# Patient Record
Sex: Female | Born: 1980 | Race: Black or African American | Hispanic: No | Marital: Single | Smoking: Current some day smoker
Health system: Southern US, Community
[De-identification: ages and names within clinical notes are randomized; demographics above are authoritative.]

## PROBLEM LIST (undated history)

## (undated) DIAGNOSIS — R7303 Prediabetes: Secondary | ICD-10-CM

## (undated) DIAGNOSIS — K219 Gastro-esophageal reflux disease without esophagitis: Secondary | ICD-10-CM

## (undated) DIAGNOSIS — E669 Obesity, unspecified: Secondary | ICD-10-CM

## (undated) HISTORY — PX: TUBAL LIGATION: SHX77

## (undated) HISTORY — DX: Prediabetes: R73.03

## (undated) HISTORY — PX: ABDOMINAL HYSTERECTOMY: SHX81

## (undated) HISTORY — PX: PARTIAL HYSTERECTOMY: SHX80

## (undated) HISTORY — DX: Obesity, unspecified: E66.9

## (undated) HISTORY — DX: Gastro-esophageal reflux disease without esophagitis: K21.9

---

## 2001-06-29 ENCOUNTER — Emergency Department (HOSPITAL_COMMUNITY): Admission: EM | Admit: 2001-06-29 | Discharge: 2001-06-29 | Payer: Self-pay | Admitting: Emergency Medicine

## 2001-11-20 ENCOUNTER — Emergency Department (HOSPITAL_COMMUNITY): Admission: EM | Admit: 2001-11-20 | Discharge: 2001-11-20 | Payer: Self-pay | Admitting: Emergency Medicine

## 2002-02-01 ENCOUNTER — Ambulatory Visit (HOSPITAL_COMMUNITY): Admission: AD | Admit: 2002-02-01 | Discharge: 2002-02-01 | Payer: Self-pay | Admitting: Obstetrics and Gynecology

## 2002-02-22 ENCOUNTER — Ambulatory Visit (HOSPITAL_COMMUNITY): Admission: AD | Admit: 2002-02-22 | Discharge: 2002-02-22 | Payer: Self-pay | Admitting: Obstetrics and Gynecology

## 2002-04-08 ENCOUNTER — Ambulatory Visit (HOSPITAL_COMMUNITY): Admission: AD | Admit: 2002-04-08 | Discharge: 2002-04-08 | Payer: Self-pay | Admitting: Obstetrics and Gynecology

## 2002-04-21 ENCOUNTER — Ambulatory Visit (HOSPITAL_COMMUNITY): Admission: AD | Admit: 2002-04-21 | Discharge: 2002-04-21 | Payer: Self-pay | Admitting: Obstetrics and Gynecology

## 2002-04-25 ENCOUNTER — Inpatient Hospital Stay (HOSPITAL_COMMUNITY): Admission: RE | Admit: 2002-04-25 | Discharge: 2002-04-27 | Payer: Self-pay | Admitting: Obstetrics and Gynecology

## 2002-05-26 ENCOUNTER — Ambulatory Visit (HOSPITAL_COMMUNITY): Admission: RE | Admit: 2002-05-26 | Discharge: 2002-05-26 | Payer: Self-pay | Admitting: Obstetrics and Gynecology

## 2002-07-06 ENCOUNTER — Emergency Department (HOSPITAL_COMMUNITY): Admission: EM | Admit: 2002-07-06 | Discharge: 2002-07-06 | Payer: Self-pay

## 2002-07-07 ENCOUNTER — Encounter: Payer: Self-pay | Admitting: *Deleted

## 2002-07-07 ENCOUNTER — Emergency Department (HOSPITAL_COMMUNITY): Admission: EM | Admit: 2002-07-07 | Discharge: 2002-07-07 | Payer: Self-pay | Admitting: *Deleted

## 2002-09-05 ENCOUNTER — Encounter: Payer: Self-pay | Admitting: *Deleted

## 2002-09-05 ENCOUNTER — Emergency Department (HOSPITAL_COMMUNITY): Admission: EM | Admit: 2002-09-05 | Discharge: 2002-09-05 | Payer: Self-pay | Admitting: *Deleted

## 2003-02-15 ENCOUNTER — Emergency Department (HOSPITAL_COMMUNITY): Admission: EM | Admit: 2003-02-15 | Discharge: 2003-02-15 | Payer: Self-pay | Admitting: Emergency Medicine

## 2003-07-23 ENCOUNTER — Emergency Department (HOSPITAL_COMMUNITY): Admission: EM | Admit: 2003-07-23 | Discharge: 2003-07-23 | Payer: Self-pay | Admitting: Emergency Medicine

## 2004-01-09 ENCOUNTER — Emergency Department (HOSPITAL_COMMUNITY): Admission: EM | Admit: 2004-01-09 | Discharge: 2004-01-09 | Payer: Self-pay | Admitting: *Deleted

## 2004-04-04 ENCOUNTER — Ambulatory Visit: Payer: Self-pay | Admitting: Family Medicine

## 2004-05-07 ENCOUNTER — Ambulatory Visit: Payer: Self-pay | Admitting: Family Medicine

## 2004-07-25 ENCOUNTER — Ambulatory Visit: Payer: Self-pay | Admitting: Family Medicine

## 2005-02-16 ENCOUNTER — Emergency Department (HOSPITAL_COMMUNITY): Admission: EM | Admit: 2005-02-16 | Discharge: 2005-02-16 | Payer: Self-pay | Admitting: Emergency Medicine

## 2006-03-04 ENCOUNTER — Emergency Department (HOSPITAL_COMMUNITY): Admission: EM | Admit: 2006-03-04 | Discharge: 2006-03-04 | Payer: Self-pay | Admitting: Emergency Medicine

## 2008-03-19 ENCOUNTER — Emergency Department (HOSPITAL_COMMUNITY): Admission: EM | Admit: 2008-03-19 | Discharge: 2008-03-19 | Payer: Self-pay | Admitting: Emergency Medicine

## 2009-03-01 ENCOUNTER — Encounter: Payer: Self-pay | Admitting: Family Medicine

## 2009-12-01 ENCOUNTER — Emergency Department (HOSPITAL_COMMUNITY): Admission: EM | Admit: 2009-12-01 | Discharge: 2009-12-01 | Payer: Self-pay | Admitting: Emergency Medicine

## 2010-07-12 LAB — URINALYSIS, ROUTINE W REFLEX MICROSCOPIC
Bilirubin Urine: NEGATIVE
Glucose, UA: 100 mg/dL — AB
Ketones, ur: NEGATIVE mg/dL
Nitrite: POSITIVE — AB
Protein, ur: 100 mg/dL — AB
Specific Gravity, Urine: 1.015 (ref 1.005–1.030)
Urobilinogen, UA: 1 mg/dL (ref 0.0–1.0)
pH: 6 (ref 5.0–8.0)

## 2010-07-12 LAB — URINE MICROSCOPIC-ADD ON

## 2010-07-12 LAB — URINE CULTURE
Colony Count: 5000
Culture  Setup Time: 201108062017

## 2010-07-12 LAB — PREGNANCY, URINE: Preg Test, Ur: NEGATIVE

## 2010-07-29 ENCOUNTER — Emergency Department (HOSPITAL_COMMUNITY)
Admission: EM | Admit: 2010-07-29 | Discharge: 2010-07-29 | Disposition: A | Discharge status: Home / Self Care | Payer: Medicaid Other | Attending: Emergency Medicine | Admitting: Emergency Medicine

## 2010-07-29 DIAGNOSIS — N920 Excessive and frequent menstruation with regular cycle: Secondary | ICD-10-CM | POA: Insufficient documentation

## 2010-07-29 LAB — CBC
MCH: 27.7 pg (ref 26.0–34.0)
MCHC: 31.9 g/dL (ref 30.0–36.0)
MCV: 86.9 fL (ref 78.0–100.0)
Platelets: 242 10*3/uL (ref 150–400)
RBC: 4.04 MIL/uL (ref 3.87–5.11)
RDW: 14 % (ref 11.5–15.5)

## 2010-07-29 LAB — DIFFERENTIAL
Basophils Relative: 1 % (ref 0–1)
Eosinophils Absolute: 0.1 10*3/uL (ref 0.0–0.7)
Eosinophils Relative: 2 % (ref 0–5)
Lymphs Abs: 2.3 10*3/uL (ref 0.7–4.0)
Monocytes Absolute: 0.4 10*3/uL (ref 0.1–1.0)
Monocytes Relative: 7 % (ref 3–12)
Neutrophils Relative %: 46 % (ref 43–77)

## 2010-07-29 LAB — POCT PREGNANCY, URINE: Preg Test, Ur: NEGATIVE

## 2010-09-13 NOTE — H&P (Signed)
   NAME:  Emma Huber, Emma Huber                          ACCOUNT NO.:  [id number]   MEDICAL RECORD NO.:  [id number]                   PATIENT TYPE:  INP   LOCATION:  A418                                 FACILITY:  [facility]   PHYSICIAN:  Tilda Burrow, M.D.              DATE OF BIRTH:  [birth year]   DATE OF ADMISSION:  04/25/2002  DATE OF DISCHARGE:                                HISTORY & PHYSICAL   REASON FOR ADMISSION:  Pregnancy at 38 weeks with spontaneous rupture of  membranes.   HISTORY OF PRESENT ILLNESS:  The patient is a 30 year old gravida 4, para 3  who was awakened this morning at approximately 4 a.m. with spontaneous  rupture of membranes and some mild cramping.  She presented to the hospital  with gross rupture of membranes and some irregular contractions.   PAST MEDICAL HISTORY:  Negative.   PAST SURGICAL HISTORY:  Negative.   ALLERGIES:  No known drug allergies.   MEDICATIONS:  Prenatal vitamins.   SOCIAL HISTORY:  She is single.  The father is present and supportive.   PRENATAL COURSE:  Essentially, uneventful.  Blood type is O positive.  UDS  was positive for marijuana.  Rubella is immune.  Hepatitis B surface antigen  is negative.  HIV is negative.  Serology nonreactive.  GC/Chlamydia are both  negative.  AFP's within normal limits.  A 28-week hemoglobin 7.1, hematocrit  32.9, 1-hour glucose tolerance was 67.  She is prior GBS positive.   PHYSICAL EXAMINATION:   VITAL SIGNS:  Stable.  Fetal heart rate is stable with accelerations noted.  There is clear leaking of fluids.   PELVIC:  Cervix is 3-4 cm, about 80% effaced and a -1 station.   PLAN:  We are going to admit and Pitocin augmentation of labor and start IV  antibiotics.      Zerita Boers, Reita Cliche, M.D.   DL/MEDQ  D:  32/35/5732  T:  04/25/2002  Job:  [id number]   cc:   [facility]

## 2010-09-13 NOTE — Op Note (Signed)
NAME:  Emma Huber, Emma Huber                          ACCOUNT NO.:  [id number]   MEDICAL RECORD NO.:  [id number]                   PATIENT TYPE:  AMB   LOCATION:  DAY                                  FACILITY:  [facility]   PHYSICIAN:  Tilda Burrow, M.D.              DATE OF BIRTH:  1980-05-01   DATE OF PROCEDURE:  05/26/2002  DATE OF DISCHARGE:                                 OPERATIVE REPORT   PREOPERATIVE DIAGNOSIS:  Elective sterilization.   POSTOPERATIVE DIAGNOSIS:  Elective sterilization.   PROCEDURE:  Laparoscopic tubal sterilization with Falope rings.   SURGEON:  Christin Bach, M.D.   ASSISTANT:  None.   ANESTHESIA:  General.   COMPLICATIONS:  None.   FINDINGS:  Normal uterus, tubes, and ovaries.  Good pelvic support.   INDICATION:  Elective permanent sterilization.   DETAILS OF PROCEDURE:  The patient was taken to the operating room, prepped  and  draped for a combined abdominal and vaginal procedure, with Hulka  tenaculum attached to the cervix for uterine  manipulation. Bladder in-and-  out catheterization. An infraumbilical, 1 cm vertical incision, as well as a  transverse suprapubic 1 cm incision. Veress needle was used to introduce  pneumoperitoneum through the umbilical incision with the pneumoperitoneum  easily introduced under 10 mmHg of pressure. Introduction of the Veress  needle was done, carefully elevating the abdominal wall and orienting the  needle toward the pelvis.   The laparoscopic trocar was then carefully introduced into the abdomen using  a similar technique, and the laparoscope was used to visualize normal pelvic  anatomy with no evidence of bleeding or trauma. The suprapubic trocar was  introduced under direct visualization, and then attention was directed to  the left fallopian tube, which was identified up to its fimbriated end,  elevated and a mid-segment loop of the tube was drawn up into the Falope  ring applier, Marcaine 0.25% applied to  the surface of the tube and the  Falope ring applied, inspected, and found to be in satisfactory position.  The opposite tube was then treated in a similar fashion. The mesosalpinx  beneath the Falope ring on each side was then infiltrated with approximately  3 cc of Marcaine 0.25%, using a transabdominal approach with a 22-gauge  spinal needle. Then the laparoscopic equipment was removed after instilling  200 cc of saline into  the abdomen and deflating the abdomen. Subcuticular 4-0 Dexon was used to  close the skin and Steri-Strips were placed on the skin surface. Sponge and  needle counts were correct. The patient tolerated the procedure well, was  awakened, and went to the recovery room in good condition.  Tilda Burrow, M.D.    JVF/MEDQ  D:  05/26/2002  T:  05/26/2002  Job:  [id number]

## 2010-09-13 NOTE — Op Note (Signed)
   NAME:  ASHELYNN, MARKS                          ACCOUNT NO.:  [id number]   MEDICAL RECORD NO.:  [id number]                   PATIENT TYPE:  INP   LOCATION:  A418                                 FACILITY:  [facility]   PHYSICIAN:  Tilda Burrow, M.D.              DATE OF BIRTH:  04/26/1981   DATE OF PROCEDURE:  DATE OF DISCHARGE:                                 OPERATIVE REPORT   ONSET OF LABOR:  April 25, 2002 at 4:30 a.m.   DATE OF DELIVERY:  April 25, 2002 at 9:42 a.m.   LENGTH OF FIRST STAGE LABOR:  Three hours and 58 minutes.   LENGTH OF SECOND STAGE LABOR:  14 minutes.   LENGTH OF THIRD STAGE LABOR:  16 minutes.   DELIVER NOTE:  The patient had a normal spontaneous vaginal delivery of a  viable female infant weighing 7 pounds 6.5 ounces.  Upon inspection perineum  was noted to be intact.  With delivery of infant, infant had vigorous cry,  good tone, pinked up well, moving all extremities.  Infant was thoroughly  suctioned.  Cord was clamped.  The infant dried and placed on the mother's  abdomen in stable condition.  Third stage of labor was actively managed with  20 units Pitocin and 1000 cc of D5 LR at a rapid rate.  Placenta was  spontaneously delivered via [facility] mechanism.  Three vessel cord was  noted.  Membranes were noted intact upon inspection.  Repeat inspection of  perineum was noted to be intact.     Zerita Boers, Reita Cliche, M.D.    DL/MEDQ  D:  04/54/0981  T:  04/25/2002  Job:  [id number]   cc:   [facility]

## 2010-11-04 ENCOUNTER — Emergency Department (HOSPITAL_COMMUNITY)
Admission: EM | Admit: 2010-11-04 | Discharge: 2010-11-04 | Disposition: A | Discharge status: Home / Self Care | Payer: Self-pay | Attending: Emergency Medicine | Admitting: Emergency Medicine

## 2010-11-04 DIAGNOSIS — T1590XA Foreign body on external eye, part unspecified, unspecified eye, initial encounter: Secondary | ICD-10-CM | POA: Insufficient documentation

## 2010-11-04 DIAGNOSIS — Y9229 Other specified public building as the place of occurrence of the external cause: Secondary | ICD-10-CM | POA: Insufficient documentation

## 2010-11-04 MED ORDER — ERYTHROMYCIN 5 MG/GM OP OINT
TOPICAL_OINTMENT | Freq: Once | OPHTHALMIC | Status: AC
  Administered 2010-11-04: 17:00:00 via OPHTHALMIC
  Filled 2010-11-04: qty 3.5

## 2010-11-04 MED ORDER — HYDROCODONE-ACETAMINOPHEN 5-325 MG PO TABS
1.0000 | ORAL_TABLET | Freq: Once | ORAL | Status: AC
  Administered 2010-11-04: 1 via ORAL
  Filled 2010-11-04: qty 1

## 2010-11-04 MED ORDER — KETOROLAC TROMETHAMINE 0.5 % OP SOLN
1.0000 [drp] | Freq: Once | OPHTHALMIC | Status: AC
  Administered 2010-11-04: 1 [drp] via OPHTHALMIC
  Filled 2010-11-04: qty 5

## 2010-11-04 MED ORDER — TETRACAINE HCL 0.5 % OP SOLN
2.0000 [drp] | Freq: Once | OPHTHALMIC | Status: AC
  Administered 2010-11-04: 2 [drp] via OPHTHALMIC
  Filled 2010-11-04: qty 2

## 2010-11-04 MED ORDER — FLUORESCEIN SODIUM 1 MG OP STRP
1.0000 | ORAL_STRIP | Freq: Once | OPHTHALMIC | Status: AC
  Administered 2010-11-04: 1 via OPHTHALMIC
  Filled 2010-11-04: qty 1

## 2010-11-04 NOTE — ED Notes (Signed)
Pt presents with nail glue to bilat eyes. Pt rinsed eyes with water and then placed clear eye eye dropps in both eyes. Pt tearful in triage.

## 2010-11-04 NOTE — ED Notes (Signed)
Order obtained from Dr. Bebe Shaggy to start Morgan's Lens with NS.

## 2010-11-04 NOTE — ED Provider Notes (Signed)
History     Chief Complaint  Patient presents with  . Foreign Body in Eye   HPI Comments: She had nail glue splash into both eyes prior to arrival while having a manicure done.  Patient is a 30 y.o. female presenting with foreign body in eye. The history is provided by the patient.  Foreign Body in Eye This is a new problem. The current episode started today. The problem occurs constantly. The problem has been unchanged. She has tried nothing for the symptoms. The treatment provided no relief.  Foreign Body in Eye This is a new problem. The current episode started today. The problem occurs constantly. The problem has been unchanged. She has tried nothing for the symptoms. The treatment provided no relief.    History reviewed. No pertinent past medical history.  History reviewed. No pertinent past surgical history.  History reviewed. No pertinent family history.  History  Substance Use Topics  . Smoking status: Never Smoker   . Smokeless tobacco: Not on file  . Alcohol Use: No    OB History    Grav Para Term Preterm Abortions TAB SAB Ect Mult Living                  Review of Systems  All other systems reviewed and are negative.    Physical Exam  BP 127/73  Pulse 84  Temp(Src) 98.7 F (37.1 C) (Oral)  Resp 20  Ht 5\' 7"  (1.702 m)  Wt 220 lb (99.791 kg)  BMI 34.46 kg/m2  SpO2 100%  Physical Exam  Constitutional: She appears well-developed and well-nourished. She appears distressed.  HENT:  Head: Normocephalic.  Eyes: EOM are normal. Pupils are equal, round, and reactive to light. Foreign body present in the right eye. Foreign body present in the left eye. Right conjunctiva is injected. Left conjunctiva is injected.  Slit lamp exam:      The right eye shows no corneal abrasion and no fluorescein uptake.       The left eye shows foreign body and fluorescein uptake. The left eye shows no corneal abrasion.         Adherent glue.  PH - 7.5 bilateral eyes after  flushing with 500 cc NS each eye.    ED Course  Procedures  MDM Spoke with Dr. Lita Mains - advised ketorolac,  abx ointment.  Will see in office in 1-2 days if not improved,  May need "bandage contact" if not better.  Patient agreeable with plan   Medical screening examination/treatment/procedure(s) were performed by non-physician practitioner and as supervising physician I was immediately available for consultation/collaboration. Visual acuity 20/70 both eyes - not wearing her glasses.  Candis Musa, PA 11/04/10 1652  Joya Gaskins, MD 11/11/10 [phone]

## 2011-01-02 ENCOUNTER — Encounter (HOSPITAL_COMMUNITY): Payer: Self-pay

## 2011-01-02 ENCOUNTER — Emergency Department (HOSPITAL_COMMUNITY): Payer: Medicaid Other

## 2011-01-02 ENCOUNTER — Emergency Department (HOSPITAL_COMMUNITY)
Admission: EM | Admit: 2011-01-02 | Discharge: 2011-01-02 | Disposition: A | Discharge status: Home / Self Care | Payer: Medicaid Other | Attending: Emergency Medicine | Admitting: Emergency Medicine

## 2011-01-02 DIAGNOSIS — F17200 Nicotine dependence, unspecified, uncomplicated: Secondary | ICD-10-CM | POA: Insufficient documentation

## 2011-01-02 DIAGNOSIS — N898 Other specified noninflammatory disorders of vagina: Secondary | ICD-10-CM | POA: Insufficient documentation

## 2011-01-02 DIAGNOSIS — D649 Anemia, unspecified: Secondary | ICD-10-CM | POA: Insufficient documentation

## 2011-01-02 DIAGNOSIS — N939 Abnormal uterine and vaginal bleeding, unspecified: Secondary | ICD-10-CM

## 2011-01-02 DIAGNOSIS — Z9851 Tubal ligation status: Secondary | ICD-10-CM | POA: Insufficient documentation

## 2011-01-02 LAB — URINALYSIS, ROUTINE W REFLEX MICROSCOPIC
Bilirubin Urine: NEGATIVE
Leukocytes, UA: NEGATIVE
Nitrite: NEGATIVE
Specific Gravity, Urine: 1.015 (ref 1.005–1.030)
Urobilinogen, UA: 0.2 mg/dL (ref 0.0–1.0)
pH: 6.5 (ref 5.0–8.0)

## 2011-01-02 LAB — PREGNANCY, URINE: Preg Test, Ur: NEGATIVE

## 2011-01-02 LAB — CBC
HCT: 24.3 % — ABNORMAL LOW (ref 36.0–46.0)
MCH: 26.8 pg (ref 26.0–34.0)
MCHC: 31.3 g/dL (ref 30.0–36.0)
MCV: 85.6 fL (ref 78.0–100.0)
Platelets: 246 10*3/uL (ref 150–400)
RDW: 15.2 % (ref 11.5–15.5)
WBC: 6.7 10*3/uL (ref 4.0–10.5)

## 2011-01-02 LAB — WET PREP, GENITAL: Clue Cells Wet Prep HPF POC: NONE SEEN

## 2011-01-02 LAB — RPR: RPR Ser Ql: NONREACTIVE

## 2011-01-02 MED ORDER — PROMETHAZINE HCL 25 MG PO TABS: 25.0000 mg | ORAL_TABLET | Freq: Four times a day (QID) | ORAL | Status: AC | PRN

## 2011-01-02 MED ORDER — KETOROLAC TROMETHAMINE 60 MG/2ML IM SOLN
60.0000 mg | Freq: Once | INTRAMUSCULAR | Status: AC
  Administered 2011-01-02: 60 mg via INTRAMUSCULAR
  Filled 2011-01-02: qty 2

## 2011-01-02 MED ORDER — NAPROXEN 500 MG PO TABS: 500.0000 mg | ORAL_TABLET | Freq: Two times a day (BID) | ORAL | Status: DC

## 2011-01-02 MED ORDER — MEGESTROL ACETATE 40 MG PO TABS: ORAL_TABLET | ORAL | Status: DC

## 2011-01-02 MED ORDER — ONDANSETRON 8 MG PO TBDP
8.0000 mg | ORAL_TABLET | Freq: Once | ORAL | Status: AC
  Administered 2011-01-02: 8 mg via ORAL
  Filled 2011-01-02: qty 1

## 2011-01-02 NOTE — ED Notes (Signed)
Correction: Urine was collected via in and out cath.

## 2011-01-02 NOTE — ED Notes (Signed)
Pt says has been on her period for the past 30 days.  Says has been having to change her pad every hour.  C/O lower abd pain, headache, and passing blood clots.  Also c/o feeling weak and dizzy.

## 2011-01-02 NOTE — ED Notes (Signed)
Patient requesting something else for pain. EDP made aware. EDP to room to talk to patient.

## 2011-01-02 NOTE — ED Provider Notes (Cosign Needed)
History   Chart scribed for Ward Givens, MD by Enos Fling; the patient was seen in room APA10/APA10; this patient's care was started at 8:31 AM.   CSN: [id number] Arrival date & time: 01/02/2011  8:20 AM  Chief Complaint  Patient presents with  . Abdominal Pain   HPI - Pt seen at 8:25 AM. Emma Huber is a 30 y.o. female who presents to the Emergency Department complaining of vaginal bleeding. Pt reports periods have been irregular x 7 months. Current period has lasted 3.5 weeks and is heavier than usual with clots and increased lower abd cramping ("like contractions") with passing of clots, and with associated nausea, headaches, and mood swings. Also c/o worsening dizziness and generalized weakness. No vomiting or fevers. Pt has not seen GYN yet but is on waiting list in [place]. Pt seen at [facility] since onset, has had pelvic exams there with no findings. Pt taking one iron pill per day for past 3 weeks. No other PMH. Pt G4P4, no h/o GYN problems. Pt reports mother and all aunts had hysterectomy before age 25. PT describes abdominal cramping then she passes a large clot.   PCP [facility]  History reviewed. No pertinent past medical history.  Past Surgical History  Procedure Date  . Tubal ligation     History reviewed. No pertinent family history.  History  Substance Use Topics  . Smoking status: Current Some Day Smoker  . Smokeless tobacco: Not on file  . Alcohol Use: Yes     occasionally    OB History    Grav Para Term Preterm Abortions TAB SAB Ect Mult Living                 Previous Medications   ACETAMINOPHEN (TYLENOL) 500 MG TABLET    Take 1,000 mg by mouth every 6 (six) hours as needed. Pain    IRON PO    Take 1 tablet by mouth daily.     NAPHAZOLINE (CLEAR EYES) 0.012 % OPHTHALMIC SOLUTION    Place 1 drop into both eyes 4 (four) times daily. Eye Irritation      Allergies as of 01/02/2011  . (No Known Allergies)     Review of Systems  All other systems reviewed and are  negative.   10 Systems reviewed and are negative for acute change except as noted in the HPI.  Physical Exam  BP 109/58  Pulse 70  Temp(Src) 98.1 F (36.7 C) (Oral)  Resp 18  Ht 5\' 6"  (1.676 m)  Wt 215 lb (97.523 kg)  BMI 34.70 kg/m2  SpO2 100%  LMP 12/06/2010  Physical Exam  Constitutional: She appears well-developed and well-nourished.  HENT:  Head: Normocephalic and atraumatic.  Eyes: Conjunctivae and EOM are normal. Pupils are equal, round, and reactive to light.       Pale conjunctiva  Cardiovascular: Normal rate, regular rhythm and normal heart sounds.   Pulmonary/Chest: Effort normal and breath sounds normal.  Abdominal: Soft. Normal appearance. There is tenderness in the suprapubic area. There is no rigidity, no guarding and no CVA tenderness.  Genitourinary: Pelvic exam was performed with patient supine. There is no rash or lesion on the right labia. There is no rash or lesion on the left labia.       Normal external genitalia, has some dark blood in vault. Has some tenderness of the uterus, nontender adenexa  Musculoskeletal: Normal range of motion.  Neurological: She is alert. She has normal strength. A cranial nerve deficit  is present.  Skin: Skin is warm, dry and intact. No rash noted.  Psychiatric: Her speech is normal and behavior is normal. Thought content normal.       Pt is tearful    ED Course  Procedures  OTHER DATA REVIEWED: Nursing notes and vital signs reviewed. Prior records reviewed.  LABS / RADIOLOGY: Results for orders placed during the hospital encounter of 01/02/11  CBC      Component Value Range   WBC 6.7  4.0 - 10.5 (K/uL)   RBC 2.84 (*) 3.87 - 5.11 (MIL/uL)   Hemoglobin 7.6 (*) 12.0 - 15.0 (g/dL)   HCT 16.1 (*) 09.6 - 46.0 (%)   MCV 85.6  78.0 - 100.0 (fL)   MCH 26.8  26.0 - 34.0 (pg)   MCHC 31.3  30.0 - 36.0 (g/dL)   RDW 04.5  40.9 - 81.1 (%)   Platelets 246  150 - 400 (K/uL)  WET PREP, GENITAL      Component Value Range    Yeast, Wet Prep NONE SEEN  NONE SEEN    Trich, Wet Prep NONE SEEN  NONE SEEN    Clue Cells, Wet Prep NONE SEEN  NONE SEEN    WBC, Wet Prep HPF POC NONE SEEN  NONE SEEN   URINALYSIS, ROUTINE W REFLEX MICROSCOPIC      Component Value Range   Color, Urine STRAW (*) YELLOW    Appearance CLEAR  CLEAR    Specific Gravity, Urine 1.015  1.005 - 1.030    pH 6.5  5.0 - 8.0    Glucose, UA NEGATIVE  NEGATIVE (mg/dL)   Hgb urine dipstick NEGATIVE  NEGATIVE    Bilirubin Urine NEGATIVE  NEGATIVE    Ketones, ur NEGATIVE  NEGATIVE (mg/dL)   Protein, ur NEGATIVE  NEGATIVE (mg/dL)   Urobilinogen, UA 0.2  0.0 - 1.0 (mg/dL)   Nitrite NEGATIVE  NEGATIVE    Leukocytes, UA NEGATIVE  NEGATIVE    Labs normal except for anemia  US Transvaginal Non-ob  01/02/2011  *RADIOLOGY REPORT*  Clinical Data: Heavy abnormal bleeding and pelvic pain  TRANSABDOMINAL AND TRANSVAGINAL ULTRASOUND OF PELVIS Technique:  Both transabdominal and transvaginal ultrasound examinations of the pelvis were performed. Transabdominal technique was performed for global imaging of the pelvis including uterus, ovaries, adnexal regions, and pelvic cul-de-sac.  Comparison: None.   It was necessary to proceed with endovaginal exam following the transabdominal exam to visualize the endometrium.  Findings: The uterus is normal in size and echotexture, measuring 9.7 x 6.3 x 6.0 cm.  Endometrial stripe is thicken and heterogeneous, measuring 21 mm in width.  Both ovaries have a normal size and appearance.  The right ovary measures 4.1 x 1.9 x 2.9 cm, and the left ovary measures 4.2 x 2.5 x 2.9 cm.  There is a 2.8 cm simple cyst on the left ovary which is within normal limits.  No adnexal masses or free pelvic fluid are identified.  IMPRESSION: Thick, heterogeneous endometrium.  Tissue sampling is recommended with the history of abnormal bleeding.  Original Report Authenticated By: Brandon Melnick, M.D.    ED COURSE: 12:38 PM Dr Emelda Fear put on megace TID  x3d, BID x 3d then daily (30 tabs). To call to be seen in the office in 2 weeks.     MDM: PT given Toradol for pain. Have discussed Dr Rayna Sexton plan and she is agreeable but now states she owes him money, that was why she was referred to a GYN in  [place] where she is on a waiting list.   IMPRESSION: 1. Anemia   2. Abnormal vaginal bleeding      PLAN: Discharge All results reviewed and discussed with pt, questions answered, pt agreeable with plan, will try to see Dr. Emelda Fear and if not then GYN in [place] in 2 weeks.  Follow-up Information    Make an appointment with [facility] Department.   Contact information:   [address] [place] [address] [phone]       Make an appointment with Tilda Burrow, MD.   Contact information:   [facility] [address], [address] [place] [address] [phone]           CONDITION ON DISCHARGE: stable  MEDS GIVEN IN ED:  Medications  ketorolac (TORADOL) injection 60 mg (60 mg Intramuscular Given 01/02/11 0857)  ondansetron (ZOFRAN-ODT) disintegrating tablet 8 mg (8 mg Oral Given 01/02/11 0856)     DISCHARGE MEDICATIONS: New Prescriptions   MEGESTROL (MEGACE) 40 MG TABLET    Take 3 po QD x3 then 2 po QD x 3d then once daily until gone.   NAPROXEN (NAPROSYN) 500 MG TABLET    Take 1 tablet (500 mg total) by mouth 2 (two) times daily.   PROMETHAZINE (PHENERGAN) 25 MG TABLET    Take 1 tablet (25 mg total) by mouth every 6 (six) hours as needed for nausea.    I personally performed the services described in this documentation, which was scribed in my presence. The recorded information has been reviewed and considered. Devoria Albe, MD, FACEP       Ward Givens, MD 01/02/11 5284  Ward Givens, MD 01/02/11 1324

## 2011-01-03 LAB — GC/CHLAMYDIA PROBE AMP, GENITAL: Chlamydia, DNA Probe: NEGATIVE

## 2011-01-28 LAB — URINALYSIS, ROUTINE W REFLEX MICROSCOPIC
Nitrite: NEGATIVE
Protein, ur: NEGATIVE
Specific Gravity, Urine: 1.02
Urobilinogen, UA: 0.2

## 2011-01-28 LAB — CBC
MCHC: 33.4
RBC: 4.16
WBC: 6

## 2011-01-28 LAB — BASIC METABOLIC PANEL
Calcium: 9.2
Creatinine, Ser: 0.9
GFR calc Af Amer: >60
GFR calc non Af Amer: >60

## 2011-01-28 LAB — DIFFERENTIAL
Basophils Relative: 0
Lymphocytes Relative: 29
Lymphs Abs: 1.7
Monocytes Relative: 8
Neutro Abs: 3.6
Neutrophils Relative %: 61

## 2011-01-28 LAB — WET PREP, GENITAL: Trich, Wet Prep: NONE SEEN

## 2011-01-28 LAB — GC/CHLAMYDIA PROBE AMP, GENITAL: Chlamydia, DNA Probe: NEGATIVE

## 2011-03-03 ENCOUNTER — Other Ambulatory Visit: Payer: Self-pay | Admitting: Obstetrics & Gynecology

## 2011-03-05 ENCOUNTER — Encounter (HOSPITAL_COMMUNITY): Payer: Self-pay | Admitting: Pharmacy Technician

## 2011-03-07 ENCOUNTER — Encounter (HOSPITAL_COMMUNITY): Payer: Self-pay

## 2011-03-07 ENCOUNTER — Encounter (HOSPITAL_COMMUNITY)
Admission: RE | Admit: 2011-03-07 | Discharge: 2011-03-07 | Disposition: A | Discharge status: Home / Self Care | Payer: Medicaid Other | Source: Ambulatory Visit | Attending: Obstetrics & Gynecology | Admitting: Obstetrics & Gynecology

## 2011-03-07 LAB — COMPREHENSIVE METABOLIC PANEL WITH GFR
ALT: 17 U/L (ref 0–35)
AST: 19 U/L (ref 0–37)
Albumin: 3.7 g/dL (ref 3.5–5.2)
Alkaline Phosphatase: 62 U/L (ref 39–117)
BUN: 13 mg/dL (ref 6–23)
CO2: 27 meq/L (ref 19–32)
Calcium: 9.8 mg/dL (ref 8.4–10.5)
Chloride: 103 meq/L (ref 96–112)
Creatinine, Ser: 1.03 mg/dL (ref 0.50–1.10)
GFR calc Af Amer: 82 mL/min — ABNORMAL LOW
GFR calc non Af Amer: 71 mL/min — ABNORMAL LOW
Glucose, Bld: 93 mg/dL (ref 70–99)
Potassium: 4 meq/L (ref 3.5–5.1)
Sodium: 138 meq/L (ref 135–145)
Total Bilirubin: 0.2 mg/dL — ABNORMAL LOW (ref 0.3–1.2)
Total Protein: 8.3 g/dL (ref 6.0–8.3)

## 2011-03-07 LAB — CBC
HCT: 36 % (ref 36.0–46.0)
Hemoglobin: 11.1 g/dL — ABNORMAL LOW (ref 12.0–15.0)
MCH: 26.2 pg (ref 26.0–34.0)
MCHC: 30.8 g/dL (ref 30.0–36.0)
MCV: 84.9 fL (ref 78.0–100.0)
Platelets: 268 K/uL (ref 150–400)
RBC: 4.24 MIL/uL (ref 3.87–5.11)
RDW: 14.5 % (ref 11.5–15.5)
WBC: 5.7 K/uL (ref 4.0–10.5)

## 2011-03-07 LAB — URINALYSIS, ROUTINE W REFLEX MICROSCOPIC
Bilirubin Urine: NEGATIVE
Glucose, UA: NEGATIVE mg/dL
Ketones, ur: NEGATIVE mg/dL
Nitrite: NEGATIVE
Protein, ur: NEGATIVE mg/dL
Specific Gravity, Urine: 1.025 (ref 1.005–1.030)
Urobilinogen, UA: 0.2 mg/dL (ref 0.0–1.0)
pH: 6 (ref 5.0–8.0)

## 2011-03-07 LAB — URINE MICROSCOPIC-ADD ON

## 2011-03-07 NOTE — Patient Instructions (Addendum)
18 Emma Huber  03/07/2011   Your procedure is scheduled on: 03/12/2011  Report to [facility] at  730  AM.  Call this number if you have problems the morning of surgery: [phone]   Remember:   Do not eat food:After Midnight.  Do not drink clear liquids: After Midnight.  Take these medicines the morning of surgery with A SIP OF WATER:  none  Do not wear jewelry, make-up or nail polish.  Do not wear lotions, powders, or perfumes. You may wear deodorant.  Do not shave 48 hours prior to surgery.  Do not bring valuables to the hospital.  Contacts, dentures or bridgework may not be worn into surgery.  Leave suitcase in the car. After surgery it may be brought to your room.  For patients admitted to the hospital, checkout time is 11:00 AM the day of discharge.   Patients discharged the day of surgery will not be allowed to drive home.  Name and phone number of your driver: family  Special Instructions: CHG Shower Use Special Wash: 1/2 bottle night before surgery and 1/2 bottle morning of surgery.   Please read over the following fact sheets that you were given: Pain Booklet, MRSA Information, Surgical Site Infection Prevention, Anesthesia Post-op Instructions and Care and Recovery After Surgery Endometrial Ablation Endometrial ablation removes the lining of the uterus (endometrium). It is usually a same day, outpatient treatment. Ablation helps avoid major surgery (such as a hysterectomy). A hysterectomy is removal of the cervix and uterus. Endometrial ablation has less risk and complications, has a shorter recovery period and is less expensive. After endometrial ablation, most women will have little or no menstrual bleeding. You may not keep your fertility. Pregnancy is no longer likely after this procedure but if you are pre-menopausal, you still need to use a reliable method of birth control following the procedure because pregnancy can occur. REASONS TO HAVE THE PROCEDURE MAY  INCLUDE:  Heavy periods.   Bleeding that is causing anemia.   Anovulatory bleeding, very irregular, bleeding.   Bleeding submucous fibroids (on the lining inside the uterus) if they are smaller than 3 centimeters.  REASONS NOT TO HAVE THE PROCEDURE MAY INCLUDE:  You wish to have more children.   You have a pre-cancerous or cancerous problem. The cause of any abnormal bleeding must be diagnosed before having the procedure.   You have pain coming from the uterus.   You have a submucus fibroid larger than 3 centimeters.   You recently had a baby.   You recently had an infection in the uterus.   You have a severe retro-flexed, tipped uterus and cannot insert the instrument to do the ablation.   You had a Cesarean section or deep major surgery on the uterus.   The inner cavity of the uterus is too large for the endometrial ablation instrument.  RISKS AND COMPLICATIONS   Perforation of the uterus.   Bleeding.   Infection of the uterus, bladder or vagina.   Injury to surrounding organs.   Cutting the cervix.   An air bubble to the lung (air embolus).   Pregnancy following the procedure.   Failure of the procedure to help the problem requiring hysterectomy.   Decreased ability to diagnose cancer in the lining of the uterus.  BEFORE THE PROCEDURE  The lining of the uterus must be tested to make sure there is no pre-cancerous or cancer cells present.   Medications may be given to make the lining of  the uterus thinner.   Ultrasound may be used to evaluate the size and look for abnormalities of the uterus.   Future pregnancy is not desired.  PROCEDURE  There are different ways to destroy the lining of the uterus.   Resectoscope - radio frequency-alternating electric current is the most common one used.   Cryotherapy - freezing the lining of the uterus.   Heated Free Liquid - heated salt (saline) solution inserted into the uterus.   Microwave - uses high energy  microwaves in the uterus.   Thermal Balloon - a catheter with a balloon tip is inserted into the uterus and filled with heated fluid.  Your caregiver will talk with you about the method used in this clinic. They will also instruct you on the pros and cons of the procedure. Endometrial ablation is performed along with a procedure called operative hysteroscopy. A narrow viewing tube is inserted through the birth canal (vagina) and through the cervix into the uterus. A tiny camera attached to the viewing tube (hysteroscope) allows the uterine cavity to be shown on a TV monitor during surgery. Your uterus is filled with a harmless liquid to make the procedure easier. The lining of the uterus is then removed. The lining can also be removed with a resectoscope which allows your surgeon to cut away the lining of the uterus under direct vision. Usually, you will be able to go home within an hour after the procedure. HOME CARE INSTRUCTIONS   Do not drive for 24 hours.   No tampons, douching or intercourse for 2 weeks or until your caregiver approves.   Rest at home for 24 to 48 hours. You may then resume normal activities unless told differently by your caregiver.   Take your temperature two times a day for 4 days, and record it.   Take any medications your caregiver has ordered, as directed.   Use some form of contraception if you are pre-menopausal and do not want to get pregnant.  Bleeding after the procedure is normal. It varies from light spotting and mildly watery to bloody discharge for 4 to 6 weeks. You may also have mild cramping. Only take over-the-counter or prescription medicines for pain, discomfort, or fever as directed by your caregiver. Do not use aspirin, as this may aggravate bleeding. Frequent urination during the first 24 hours is normal. You will not know how effective your surgery is until at least 3 months after the surgery. SEEK IMMEDIATE MEDICAL CARE IF:   Bleeding is heavier than  a normal menstrual cycle.   An oral temperature above 102 F (38.9 C) develops.   You have increasing cramps or pains not relieved with medication or develop belly (abdominal) pain which does not seem to be related to the same area of earlier cramping and pain.   You are light headed, weak or have fainting episodes.   You develop pain in the shoulder strap areas.   You have chest or leg pain.   You have abnormal vaginal discharge.   You have painful urination.  Document Released: 02/22/2004 Document Revised: 12/25/2010 Document Reviewed: 05/22/2007 [facility] Patient Information 2012 [place], [place].PATIENT INSTRUCTIONS POST-ANESTHESIA  IMMEDIATELY FOLLOWING SURGERY:  Do not drive or operate machinery for the first twenty four hours after surgery.  Do not make any important decisions for twenty four hours after surgery or while taking narcotic pain medications or sedatives.  If you develop intractable nausea and vomiting or a severe headache please notify your doctor immediately.  FOLLOW-UP:  Please make an appointment with your surgeon as instructed. You do not need to follow up with anesthesia unless specifically instructed to do so.  WOUND CARE INSTRUCTIONS (if applicable):  Keep a dry clean dressing on the anesthesia/puncture wound site if there is drainage.  Once the wound has quit draining you may leave it open to air.  Generally you should leave the bandage intact for twenty four hours unless there is drainage.  If the epidural site drains for more than 36-48 hours please call the anesthesia department.  QUESTIONS?:  Please feel free to call your physician or the hospital operator if you have any questions, and they will be happy to assist you.     [facility] Anesthesia Department [address] [place] [phone]

## 2011-03-12 ENCOUNTER — Ambulatory Visit (HOSPITAL_COMMUNITY)
Admission: RE | Admit: 2011-03-12 | Discharge: 2011-03-12 | Disposition: A | Discharge status: Home / Self Care | Payer: Medicaid Other | Source: Ambulatory Visit | Attending: Obstetrics & Gynecology | Admitting: Obstetrics & Gynecology

## 2011-03-12 ENCOUNTER — Encounter (HOSPITAL_COMMUNITY): Payer: Self-pay | Admitting: Anesthesiology

## 2011-03-12 ENCOUNTER — Encounter (HOSPITAL_COMMUNITY): Payer: Self-pay | Admitting: *Deleted

## 2011-03-12 ENCOUNTER — Other Ambulatory Visit: Payer: Self-pay | Admitting: Obstetrics & Gynecology

## 2011-03-12 ENCOUNTER — Ambulatory Visit (HOSPITAL_COMMUNITY): Payer: Medicaid Other | Admitting: Anesthesiology

## 2011-03-12 ENCOUNTER — Encounter (HOSPITAL_COMMUNITY)
Admission: RE | Disposition: A | Discharge status: Home / Self Care | Payer: Self-pay | Source: Ambulatory Visit | Attending: Obstetrics & Gynecology

## 2011-03-12 DIAGNOSIS — N920 Excessive and frequent menstruation with regular cycle: Secondary | ICD-10-CM | POA: Insufficient documentation

## 2011-03-12 DIAGNOSIS — D259 Leiomyoma of uterus, unspecified: Secondary | ICD-10-CM | POA: Insufficient documentation

## 2011-03-12 DIAGNOSIS — Z9889 Other specified postprocedural states: Secondary | ICD-10-CM

## 2011-03-12 HISTORY — PX: ENDOMETRIAL ABLATION: SHX621

## 2011-03-12 HISTORY — PX: HYSTEROSCOPY W/D&C: SHX1775

## 2011-03-12 SURGERY — DILATATION AND CURETTAGE /HYSTEROSCOPY
Anesthesia: General | Site: Uterus | Wound class: Clean Contaminated

## 2011-03-12 MED ORDER — OXYCODONE-ACETAMINOPHEN 5-325 MG PO TABS
1.0000 | ORAL_TABLET | Freq: Once | ORAL | Status: AC
  Administered 2011-03-12: 1 via ORAL

## 2011-03-12 MED ORDER — MIDAZOLAM HCL 2 MG/2ML IJ SOLN
INTRAMUSCULAR | Status: AC
  Administered 2011-03-12: 2 mg via INTRAVENOUS
  Filled 2011-03-12: qty 2

## 2011-03-12 MED ORDER — HYDROCODONE-ACETAMINOPHEN 5-500 MG PO TABS: 1.0000 | ORAL_TABLET | Freq: Four times a day (QID) | ORAL | Status: AC | PRN

## 2011-03-12 MED ORDER — PROPOFOL 10 MG/ML IV EMUL
INTRAVENOUS | Status: AC
  Filled 2011-03-12: qty 20

## 2011-03-12 MED ORDER — DEXTROSE 5 % IV SOLN
INTRAVENOUS | Status: DC | PRN
  Administered 2011-03-12: 500 mL via INTRAVENOUS

## 2011-03-12 MED ORDER — MIDAZOLAM HCL 2 MG/2ML IJ SOLN
INTRAMUSCULAR | Status: AC
  Filled 2011-03-12: qty 2

## 2011-03-12 MED ORDER — MIDAZOLAM HCL 5 MG/5ML IJ SOLN
INTRAMUSCULAR | Status: DC | PRN
  Administered 2011-03-12: 2 mg via INTRAVENOUS

## 2011-03-12 MED ORDER — LIDOCAINE HCL (PF) 1 % IJ SOLN
INTRAMUSCULAR | Status: AC
  Filled 2011-03-12: qty 5

## 2011-03-12 MED ORDER — CEFAZOLIN SODIUM 1-5 GM-% IV SOLN: 1.0000 g | INTRAVENOUS | Status: DC

## 2011-03-12 MED ORDER — CEFAZOLIN SODIUM 1-5 GM-% IV SOLN
INTRAVENOUS | Status: DC | PRN
  Administered 2011-03-12: 1 g via INTRAVENOUS

## 2011-03-12 MED ORDER — ONDANSETRON HCL 8 MG PO TABS: 8.0000 mg | ORAL_TABLET | Freq: Three times a day (TID) | ORAL | Status: AC | PRN

## 2011-03-12 MED ORDER — LIDOCAINE HCL 1 % IJ SOLN
INTRAMUSCULAR | Status: DC | PRN
  Administered 2011-03-12: 30 mg via INTRADERMAL

## 2011-03-12 MED ORDER — ONDANSETRON HCL 4 MG/2ML IJ SOLN
INTRAMUSCULAR | Status: AC
  Administered 2011-03-12: 4 mg via INTRAVENOUS
  Filled 2011-03-12: qty 2

## 2011-03-12 MED ORDER — ONDANSETRON HCL 4 MG/2ML IJ SOLN: 4.0000 mg | Freq: Once | INTRAMUSCULAR | Status: DC | PRN

## 2011-03-12 MED ORDER — FENTANYL CITRATE 0.05 MG/ML IJ SOLN
25.0000 ug | INTRAMUSCULAR | Status: DC | PRN
  Administered 2011-03-12: 50 ug via INTRAVENOUS

## 2011-03-12 MED ORDER — CEFAZOLIN SODIUM 1-5 GM-% IV SOLN
INTRAVENOUS | Status: AC
  Filled 2011-03-12: qty 50

## 2011-03-12 MED ORDER — OXYCODONE-ACETAMINOPHEN 5-325 MG PO TABS
ORAL_TABLET | ORAL | Status: AC
  Administered 2011-03-12: 1 via ORAL
  Filled 2011-03-12: qty 1

## 2011-03-12 MED ORDER — FENTANYL CITRATE 0.05 MG/ML IJ SOLN
INTRAMUSCULAR | Status: AC
  Filled 2011-03-12: qty 2

## 2011-03-12 MED ORDER — PROPOFOL 10 MG/ML IV EMUL
INTRAVENOUS | Status: DC | PRN
  Administered 2011-03-12: 160 mg via INTRAVENOUS
  Administered 2011-03-12: 20 mg via INTRAVENOUS

## 2011-03-12 MED ORDER — SODIUM CHLORIDE 0.9 % IR SOLN
Status: DC | PRN
  Administered 2011-03-12: 1000 mL

## 2011-03-12 MED ORDER — KETOROLAC TROMETHAMINE 30 MG/ML IJ SOLN
INTRAMUSCULAR | Status: AC
  Administered 2011-03-12: 30 mg via INTRAVENOUS
  Filled 2011-03-12: qty 1

## 2011-03-12 MED ORDER — MIDAZOLAM HCL 2 MG/2ML IJ SOLN
1.0000 mg | INTRAMUSCULAR | Status: DC | PRN
  Administered 2011-03-12 (×2): 2 mg via INTRAVENOUS

## 2011-03-12 MED ORDER — FENTANYL CITRATE 0.05 MG/ML IJ SOLN
INTRAMUSCULAR | Status: AC
  Administered 2011-03-12: 50 ug via INTRAVENOUS
  Filled 2011-03-12: qty 2

## 2011-03-12 MED ORDER — KETOROLAC TROMETHAMINE 10 MG PO TABS: 10.0000 mg | ORAL_TABLET | Freq: Three times a day (TID) | ORAL | Status: AC | PRN

## 2011-03-12 MED ORDER — FENTANYL CITRATE 0.05 MG/ML IJ SOLN
INTRAMUSCULAR | Status: DC | PRN
  Administered 2011-03-12 (×2): 25 ug via INTRAVENOUS
  Administered 2011-03-12: 50 ug via INTRAVENOUS

## 2011-03-12 MED ORDER — ONDANSETRON HCL 4 MG/2ML IJ SOLN
4.0000 mg | Freq: Once | INTRAMUSCULAR | Status: AC
  Administered 2011-03-12: 4 mg via INTRAVENOUS

## 2011-03-12 MED ORDER — KETOROLAC TROMETHAMINE 30 MG/ML IJ SOLN
30.0000 mg | Freq: Once | INTRAMUSCULAR | Status: AC
  Administered 2011-03-12: 30 mg via INTRAVENOUS

## 2011-03-12 MED ORDER — LACTATED RINGERS IV SOLN
INTRAVENOUS | Status: DC
Start: 2011-03-12 — End: 2011-03-12
  Administered 2011-03-12: 1000 mL via INTRAVENOUS

## 2011-03-12 SURGICAL SUPPLY — 32 items
BAG DECANTER FOR FLEXI CONT (MISCELLANEOUS) ×3 IMPLANT
BAG HAMPER (MISCELLANEOUS) ×3 IMPLANT
CATH THERMACHOICE III (CATHETERS) ×6 IMPLANT
CLOTH BEACON ORANGE TIMEOUT ST (SAFETY) ×3 IMPLANT
COVER LIGHT HANDLE STERIS (MISCELLANEOUS) ×6 IMPLANT
FORMALIN 10 PREFIL 120ML (MISCELLANEOUS) ×3 IMPLANT
GAUZE SPONGE 4X4 16PLY XRAY LF (GAUZE/BANDAGES/DRESSINGS) ×3 IMPLANT
GLOVE BIOGEL PI IND STRL 7.0 (GLOVE) ×4 IMPLANT
GLOVE BIOGEL PI IND STRL 8 (GLOVE) ×2 IMPLANT
GLOVE BIOGEL PI INDICATOR 7.0 (GLOVE) ×2
GLOVE BIOGEL PI INDICATOR 8 (GLOVE) ×1
GLOVE ECLIPSE 8.0 STRL XLNG CF (GLOVE) ×3 IMPLANT
GLOVE EXAM NITRILE MD LF STRL (GLOVE) ×3 IMPLANT
GLOVE SS BIOGEL STRL SZ 6.5 (GLOVE) ×2 IMPLANT
GLOVE SUPERSENSE BIOGEL SZ 6.5 (GLOVE) ×1
GOWN BRE IMP SLV AUR XL STRL (GOWN DISPOSABLE) ×6 IMPLANT
GOWN STRL REIN XL XLG (GOWN DISPOSABLE) ×3 IMPLANT
INST SET HYSTEROSCOPY (KITS) ×3 IMPLANT
IV D5W 500ML (IV SOLUTION) ×3 IMPLANT
IV NS IRRIG 3000ML ARTHROMATIC (IV SOLUTION) ×3 IMPLANT
KIT ROOM TURNOVER APOR (KITS) ×3 IMPLANT
MANIFOLD NEPTUNE II (INSTRUMENTS) ×3 IMPLANT
MARKER SKIN DUAL TIP RULER LAB (MISCELLANEOUS) ×3 IMPLANT
NS IRRIG 1000ML POUR BTL (IV SOLUTION) ×3 IMPLANT
PACK BASIC III (CUSTOM PROCEDURE TRAY) ×1
PACK SRG BSC III STRL LF ECLPS (CUSTOM PROCEDURE TRAY) ×2 IMPLANT
PAD ARMBOARD 7.5X6 YLW CONV (MISCELLANEOUS) ×3 IMPLANT
PAD TELFA 3X4 1S STER (GAUZE/BANDAGES/DRESSINGS) ×3 IMPLANT
SET BASIN LINEN APH (SET/KITS/TRAYS/PACK) ×3 IMPLANT
SET IRRIG Y TYPE TUR BLADDER L (SET/KITS/TRAYS/PACK) ×3 IMPLANT
SHEET LAVH (DRAPES) ×3 IMPLANT
YANKAUER SUCT BULB TIP 10FT TU (MISCELLANEOUS) ×3 IMPLANT

## 2011-03-12 NOTE — Op Note (Addendum)
Preoperative diagnosis:  1.  Menometrorrhagia                                         2.  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  Hysteroscopy and uterine curettage with attempted endometrial ablation                     2.  placement of the Mirena levered megestrol releasing intrauterine system  Surgeon:  Rockne Coons MD  Anesthesia:  Gen. Endotracheal  Findings:  Patient had a normal endometrial cavity with no fibroid polyps or other abnormalities.  However it was large and spongy.  Even though it measured 12 cm in ultrasound 3-dimensional he it was quite large and again very spongy uterus.  I used to pressure catheters both of which had catheter failures I suspect because of the large amount of fluid that I was using.  I had 120 cc in the balloon each time to maintain a pressure above 150 mmHg.  Description of operation:  Patient was taken to the operating room and placed in the supine position where she underwent general endotracheal anesthesia.  She was placed in the low lithotomy position and prepped and draped in the usual sterile fashion.  A Graves speculum was placed and the cervix was grasped with a single-tooth tenaculum.  The cervix was then dilated serially to allow passage of the hysteroscope.  A diagnostic hysteroscopy was performed and the endometrial cavity was found to be completely normal, there were no fibroids or polyp.  A vigorous uterine curettage was then performed with good uterine crie obtained in all areas.  The tissue was sent off to pathology for routine evaluation.  I then performed and endometrial ablation and it required 115 220 cc of D5W to made a pressure between 150 and 200 mm mercury.  I was confident both visually and clinically that there was absolutely no uterine perforation.  I get to pressure catheter air her readings of 6806 because of the large  volumes of fluid I was using.  As a result to try to salvage some clinical benefit for this patient, I called over to our office and had been sent over and Mirena IUD for placement.  My intent was to see if this would be adequate for control of her menstrual cycles.  Her only other option is going to be an abdominal hysterectomy because her uterus had no descent and was quite high in the abdomen.  The patient has had a previous tubal ligation so I thought it was worth a chance to try the Mirena.  If it is unsuccessful we can be easily removed in the office.    The patient was awakened from anesthesia and taken to the recovery room in good stable conditions with all counts being correct x3.  The patient received 1 g of Ancef and 30 mg of Toradol preoperatively.  Blood loss for the procedure was minimal.  There were no complications except for the inability to do the ablation because of the patient's large endometrial cavity.  Lazaro Arms 03/12/2011 10:23 AM     The Mirena IUD has Lot #[id number] use by date 06/2013.  If left in for the entire life of IUD needs to be changed on or  about 03/11/2016.  EURE,LUTHER H 03/12/2011 10:28 AM

## 2011-03-12 NOTE — Transfer of Care (Signed)
Immediate Anesthesia Transfer of Care Note  Patient: Emma Huber  Procedure(s) Performed:  DILATATION AND CURETTAGE (D&C) /HYSTEROSCOPY; ENDOMETRIAL ABLATION - attempted ablation    unsuccessful  Mirena IUD inserted Lot# [id number]   EXP-06/26/2013  Patient Location: PACU  Anesthesia Type: General  Level of Consciousness: awake, alert , oriented and patient cooperative  Airway & Oxygen Therapy: Patient Spontanous Breathing and Patient connected to face mask oxygen  Post-op Assessment: Report given to PACU RN, Post -op Vital signs reviewed and stable and Patient moving all extremities  Post vital signs: Reviewed and stable  Complications: No apparent anesthesia complications

## 2011-03-12 NOTE — Anesthesia Procedure Notes (Signed)
Procedures

## 2011-03-12 NOTE — Anesthesia Postprocedure Evaluation (Signed)
  Anesthesia Post-op Note  Patient: Emma Huber  Procedure(s) Performed:  DILATATION AND CURETTAGE (D&C) /HYSTEROSCOPY; ENDOMETRIAL ABLATION - attempted ablation    unsuccessful  Mirena IUD inserted Lot# [id number]   EXP-06/26/2013  Patient Location: PACU  Anesthesia Type: General  Level of Consciousness: awake, alert , oriented and patient cooperative  Airway and Oxygen Therapy: Patient Spontanous Breathing  Post-op Pain: 2 /10, mild  Post-op Assessment: Post-op Vital signs reviewed, Patient's Cardiovascular Status Stable, Respiratory Function Stable, Patent Airway and No signs of Nausea or vomiting  Post-op Vital Signs: Reviewed and stable  Complications: No apparent anesthesia complications

## 2011-03-12 NOTE — Anesthesia Preprocedure Evaluation (Addendum)
Anesthesia Evaluation  Patient identified by MRN, date of birth, ID band Patient awake    Reviewed: Allergy & Precautions, H&P , NPO status , Patient's Chart, lab work & pertinent test results  History of Anesthesia Complications Negative for: history of anesthetic complications  Airway Mallampati: I      Dental  (+) Teeth Intact   Pulmonary neg pulmonary ROS,  clear to auscultation        Cardiovascular neg cardio ROS Regular Normal    Neuro/Psych    GI/Hepatic negative GI ROS,   Endo/Other    Renal/GU      Musculoskeletal   Abdominal   Peds  Hematology   Anesthesia Other Findings   Reproductive/Obstetrics                           Anesthesia Physical Anesthesia Plan  ASA: I  Anesthesia Plan: General   Post-op Pain Management:    Induction: Intravenous  Airway Management Planned: LMA  Additional Equipment:   Intra-op Plan:   Post-operative Plan: Extubation in OR  Informed Consent: I have reviewed the patients History and Physical, chart, labs and discussed the procedure including the risks, benefits and alternatives for the proposed anesthesia with the patient or authorized representative who has indicated his/her understanding and acceptance.     Plan Discussed with:   Anesthesia Plan Comments:         Anesthesia Quick Evaluation

## 2011-03-12 NOTE — H&P (Signed)
Emma Huber is an 30 y.o. female. G 5 P 4 A 1 LMP 01/05/2011 status post BTL on megestrol with extremely heavy cycles.  She soils clothes and sheets and wears 2 super maxis per day on the first few days.  Pain not a major issue.  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   Past Surgical History  Procedure Date  . Tubal ligation 9 yrs ago    [facility]    Family History  Problem Relation Age of Onset  . Anesthesia problems Neg Hx   . Hypotension Neg Hx   . Malignant hyperthermia Neg Hx   . Pseudochol deficiency Neg Hx     Social History:  reports that she has been smoking Cigarettes.  She has a 1.75 pack-year smoking history. She does not have any smokeless tobacco history on file. She reports that she drinks alcohol. She reports that she does not use illicit drugs.  Allergies: No Known Allergies     ROS  Review of Systems  Constitutional: Negative for fever, chills, weight loss, malaise/fatigue and diaphoresis.  HENT: Negative for hearing loss, ear pain, nosebleeds, congestion, sore throat, neck pain, tinnitus and ear discharge.   Eyes: Negative for blurred vision, double vision, photophobia, pain, discharge and redness.  Respiratory: Negative for cough, hemoptysis, sputum production, shortness of breath, wheezing and stridor.   Cardiovascular: Negative for chest pain, palpitations, orthopnea, claudication, leg swelling and PND.  Gastrointestinal: Positive for abdominal pain with menses. Negative for heartburn, nausea, vomiting, diarrhea, constipation, blood in stool and melena.  Genitourinary: Negative for dysuria, urgency, frequency, hematuria and flank pain.  Musculoskeletal: Negative for myalgias, back pain, joint pain and falls.  Skin: Negative for itching and rash.  Neurological: Negative for dizziness, tingling, tremors, sensory change, speech change, focal weakness, seizures, loss of consciousness, weakness and headaches.  Endo/Heme/Allergies: Negative for environmental  allergies and polydipsia. Does not bruise/bleed easily.  Psychiatric/Behavioral: Negative for depression, suicidal ideas, hallucinations, memory loss and substance abuse. The patient is not nervous/anxious and does not have insomnia.       Physical Exam Physical Exam  Vitals reviewed. Constitutional: She is oriented to person, place, and time. She appears well-developed and well-nourished.  HENT:  Head: Normocephalic and atraumatic.  Right Ear: External ear normal.  Left Ear: External ear normal.  Nose: Nose normal.  Mouth/Throat: Oropharynx is clear and moist.  Eyes: Conjunctivae and EOM are normal. Pupils are equal, round, and reactive to light. Right eye exhibits no discharge. Left eye exhibits no discharge. No scleral icterus.  Neck: Normal range of motion. Neck supple. No tracheal deviation present. No thyromegaly present.  Cardiovascular: Normal rate, regular rhythm, normal heart sounds and intact distal pulses.  Exam reveals no gallop and no friction rub.   No murmur heard. Respiratory: Effort normal and breath sounds normal. No respiratory distress. She has no wheezes. She has no rales. She exhibits no tenderness.  GI: Soft. Bowel sounds are normal. She exhibits no distension and no mass. There is no tenderness. There is no rebound and no guarding.  Genitourinary:       Vulva is normal without lesions Vagina is pink moist without discharge Cervix normal in appearance and pap is normal Uterus is enlarged to 8 weeks size Adnexa is negative with normal sized ovaries by sonogram  Musculoskeletal: Normal range of motion. She exhibits no edema and no tenderness.  Neurological: She is alert and oriented to person, place, and time. She has normal reflexes. She displays normal reflexes. No  cranial nerve deficit. She exhibits normal muscle tone. Coordination normal.  Skin: Skin is warm and dry. No rash noted. No erythema. No pallor.  Psychiatric: She has a normal mood and affect. Her  behavior is normal. Judgment and thought content normal.    Recent Results (from the past 336 hour(s))  SURGICAL PCR SCREEN   Collection Time   03/07/11  8:15 AM      Component Value Range   MRSA, PCR NEGATIVE  NEGATIVE    Staphylococcus aureus NEGATIVE  NEGATIVE   URINALYSIS, ROUTINE W REFLEX MICROSCOPIC   Collection Time   03/07/11  8:15 AM      Component Value Range   Color, Urine YELLOW  YELLOW    Appearance HAZY (*) CLEAR    Specific Gravity, Urine 1.025  1.005 - 1.030    pH 6.0  5.0 - 8.0    Glucose, UA NEGATIVE  NEGATIVE (mg/dL)   Hgb urine dipstick SMALL (*) NEGATIVE    Bilirubin Urine NEGATIVE  NEGATIVE    Ketones, ur NEGATIVE  NEGATIVE (mg/dL)   Protein, ur NEGATIVE  NEGATIVE (mg/dL)   Urobilinogen, UA 0.2  0.0 - 1.0 (mg/dL)   Nitrite NEGATIVE  NEGATIVE    Leukocytes, UA TRACE (*) NEGATIVE   URINE MICROSCOPIC-ADD ON   Collection Time   03/07/11  8:15 AM      Component Value Range   Squamous Epithelial / LPF FEW (*) RARE    WBC, UA 3-6  <3 (WBC/hpf)   RBC / HPF 0-2  <3 (RBC/hpf)   Bacteria, UA FEW (*) RARE   CBC   Collection Time   03/07/11  8:30 AM      Component Value Range   WBC 5.7  4.0 - 10.5 (K/uL)   RBC 4.24  3.87 - 5.11 (MIL/uL)   Hemoglobin 11.1 (*) 12.0 - 15.0 (g/dL)   HCT 16.1  09.6 - 04.5 (%)   MCV 84.9  78.0 - 100.0 (fL)   MCH 26.2  26.0 - 34.0 (pg)   MCHC 30.8  30.0 - 36.0 (g/dL)   RDW 40.9  81.1 - 91.4 (%)   Platelets 268  150 - 400 (K/uL)  COMPREHENSIVE METABOLIC PANEL   Collection Time   03/07/11  8:30 AM      Component Value Range   Sodium 138  135 - 145 (mEq/L)   Potassium 4.0  3.5 - 5.1 (mEq/L)   Chloride 103  96 - 112 (mEq/L)   CO2 27  19 - 32 (mEq/L)   Glucose, Bld 93  70 - 99 (mg/dL)   BUN 13  6 - 23 (mg/dL)   Creatinine, Ser 7.82  0.50 - 1.10 (mg/dL)   Calcium 9.8  8.4 - 95.6 (mg/dL)   Total Protein 8.3  6.0 - 8.3 (g/dL)   Albumin 3.7  3.5 - 5.2 (g/dL)   AST 19  0 - 37 (U/L)   ALT 17  0 - 35 (U/L)   Alkaline Phosphatase 62  39  - 117 (U/L)   Total Bilirubin 0.2 (*) 0.3 - 1.2 (mg/dL)   GFR calc non Af Amer 71 (*) >90 (mL/min)   GFR calc Af Amer 82 (*) >90 (mL/min)  HCG, QUANTITATIVE, PREGNANCY   Collection Time   03/07/11  8:30 AM      Component Value Range   hCG, Beta Chain, Quant, S <1  <5 (mIU/mL)        Assessment/Plan: 1.  Menometrorrhagia 2.  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  For hysteroscopy D&C endometrial ablation.  Pt understands the risks of surgery including but not limited t  excessive bleeding requiring transfusion or reoperation, post-operative infection requiring prolonged hospitalization or re-hospitalization and antibiotic therapy, and damage to other organs including bladder, bowel, ureters and major vessels.  The patient also understands the alternative treatment options which were discussed in full.  All questions were answered.   EURE,LUTHER H 03/12/2011, 7:19 AM

## 2011-03-18 ENCOUNTER — Encounter (HOSPITAL_COMMUNITY): Payer: Self-pay | Admitting: Obstetrics & Gynecology

## 2011-09-15 ENCOUNTER — Encounter (HOSPITAL_COMMUNITY): Payer: Self-pay | Admitting: *Deleted

## 2011-09-15 ENCOUNTER — Emergency Department (HOSPITAL_COMMUNITY)
Admission: EM | Admit: 2011-09-15 | Discharge: 2011-09-15 | Disposition: A | Discharge status: Home / Self Care | Payer: No Typology Code available for payment source | Attending: Emergency Medicine | Admitting: Emergency Medicine

## 2011-09-15 DIAGNOSIS — M545 Low back pain, unspecified: Secondary | ICD-10-CM | POA: Insufficient documentation

## 2011-09-15 DIAGNOSIS — Y92410 Unspecified street and highway as the place of occurrence of the external cause: Secondary | ICD-10-CM | POA: Insufficient documentation

## 2011-09-15 DIAGNOSIS — S39012A Strain of muscle, fascia and tendon of lower back, initial encounter: Secondary | ICD-10-CM

## 2011-09-15 DIAGNOSIS — S335XXA Sprain of ligaments of lumbar spine, initial encounter: Secondary | ICD-10-CM | POA: Insufficient documentation

## 2011-09-15 DIAGNOSIS — F17200 Nicotine dependence, unspecified, uncomplicated: Secondary | ICD-10-CM | POA: Insufficient documentation

## 2011-09-15 MED ORDER — HYDROCODONE-ACETAMINOPHEN 5-325 MG PO TABS: ORAL_TABLET | ORAL | Status: DC

## 2011-09-15 MED ORDER — METHOCARBAMOL 500 MG PO TABS: ORAL_TABLET | ORAL | Status: DC
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
## 2011-09-15 NOTE — Discharge Instructions (Signed)
° °
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
## 2011-09-20 NOTE — ED Provider Notes (Signed)
Medical screening examination/treatment/procedure(s) were performed by non-physician practitioner and as supervising physician I was immediately available for consultation/collaboration. Anaston Koehn, MD, FACEP   Xzavian Semmel L Tynell Winchell, MD 09/20/11 2231 

## 2011-11-07 ENCOUNTER — Other Ambulatory Visit (HOSPITAL_COMMUNITY): Payer: No Typology Code available for payment source

## 2011-11-14 ENCOUNTER — Other Ambulatory Visit (HOSPITAL_COMMUNITY): Payer: No Typology Code available for payment source

## 2011-11-28 ENCOUNTER — Encounter (HOSPITAL_COMMUNITY): Payer: Self-pay | Admitting: Pharmacy Technician

## 2011-12-02 NOTE — Patient Instructions (Addendum)
20 Emma Huber  12/02/2011   Your procedure is scheduled on:  12/10/2011  Report to [facility] at  700  AM.  Call this number if you have problems the morning of surgery: [phone]   Remember:   Do not eat food:After Midnight.  May have clear liquids:until Midnight .    Take these medicines the morning of surgery with A SIP OF WATER: claritin,zantac   Do not wear jewelry, make-up or nail polish.  Do not wear lotions, powders, or perfumes. You may wear deodorant.  Do not shave 48 hours prior to surgery. Men may shave face and neck.  Do not bring valuables to the hospital.  Contacts, dentures or bridgework may not be worn into surgery.  Leave suitcase in the car. After surgery it may be brought to your room.  For patients admitted to the hospital, checkout time is 11:00 AM the day of discharge.   Patients discharged the day of surgery will not be allowed to drive home.  Name and phone number of your driver: family  Special Instructions: CHG Shower Use Special Wash: 1/2 bottle night before surgery and 1/2 bottle morning of surgery.   Please read over the following fact sheets that you were given: Pain Booklet, MRSA Information, Surgical Site Infection Prevention, Anesthesia Post-op Instructions and Care and Recovery After Surgery Laparoscopic Assisted Vaginal Hysterectomy  A laparoscopic assisted vaginal hysterectomy (LAVH) is a surgical procedure to remove the uterus and cervix, and sometimes the ovaries and fallopian tubes. During an LAVH, some of the surgical removal is done through the vagina and the rest is done through a few small surgical cuts (incisions) in the abdomen.  This procedure is usually considered in women when a vaginal hysterectomy (VH) is not an option. Your surgeon will discuss the risks and benefits of the different surgical techniques at your appointment. Symptoms such as chronic pelvic pain, pressure, and heavy or painful periods can be treated with an LAVH.  Generally, recovery time is faster and there are fewer complications after laparoscopic procedures than after open incisional procedures. LET YOUR CAREGIVER KNOW ABOUT:   Allergies to food or medicine.   Medicines taken, including vitamins, herbs, eyedrops, over-the-counter medicines, and creams.   Use of steroids (by mouth or creams).   Previous problems with anesthetics or numbing medicines.   History of bleeding problems or blood clots.   Previous surgery.   Other health problems, including diabetes and kidney problems.   Possibility of pregnancy.  RISKS AND COMPLICATIONS  Allergies to medicines.   Difficulty breathing.   Bleeding.   Infection.   Damage to other structures near the uterus and cervix.  BEFORE THE PROCEDURE  Ask your caregiver about changing or stopping your regular medicines.   Take certain medicines, such as a colon emptying preparation, as directed.   Do not eat or drink anything for at least 8 hours before your surgery.   Take a shower at home the night before your procedure.   Arrive without jewelry, makeup, nail polish, or any lotions or deodorants.   Stop smoking if you smoke. Stopping will improve your health after surgery.   Arrange for a ride home after surgery and for help at home during recovery.  PROCEDURE   An intravenous (IV) access tube will be put into one of your veins in order to give you fluids and medicines.   You will receive medicines to relax you and medicines that make you sleep (general anesthetic).  You may have a flexible tube (catheter) put into your bladder to drain urine.   You may have a tube put through your nose or mouth that goes into your stomach (nasogastric tube). The nasogastric tube removes digestive fluids and prevents you from feeling nauseous and vomiting.   Tight fitting (compression) stockings will be placed on your legs to promote circulation.   Three to four small incisions will be made in the  abdomen. An incision is also made in the vagina. Probes and tools are inserted into the small incisions. The uterus and cervix are removed (and possibly your ovaries and fallopian tubes) through the vagina, as well as through the 3 to 4 small incisions that were made in the abdomen.   The vagina is then sewn back to normal.   The procedure takes about 1 to 3 hours.  AFTER THE PROCEDURE  Plan to be in the hospital for 1 to 2 days.   You may have abdominal cramping and a sore throat. Your pain will be controlled with medicine.   You may have a liquid diet temporarily. You will most likely return to, and tolerate, your usual diet the day after surgery.   You will be passing urine through a catheter. It will be removed the day after surgery.   Your temperature, breathing rate, heart rate, blood pressure, and oxygen level will be monitored regularly.   You will still wear compression stockings on your legs until you are able to move around.   You will use a special device or do breathing exercises to keep your lungs clear.   You will be encouraged to walk as soon as possible.   Expect a full recovery in 4 to 6 weeks after surgery.  Document Released: 04/03/2011 Document Reviewed: 04/01/2011 [facility] Patient Information 2012 [place], [place].PATIENT INSTRUCTIONS POST-ANESTHESIA  IMMEDIATELY FOLLOWING SURGERY:  Do not drive or operate machinery for the first twenty four hours after surgery.  Do not make any important decisions for twenty four hours after surgery or while taking narcotic pain medications or sedatives.  If you develop intractable nausea and vomiting or a severe headache please notify your doctor immediately.  FOLLOW-UP:  Please make an appointment with your surgeon as instructed. You do not need to follow up with anesthesia unless specifically instructed to do so.  WOUND CARE INSTRUCTIONS (if applicable):  Keep a dry clean dressing on the anesthesia/puncture wound site if  there is drainage.  Once the wound has quit draining you may leave it open to air.  Generally you should leave the bandage intact for twenty four hours unless there is drainage.  If the epidural site drains for more than 36-48 hours please call the anesthesia department.  QUESTIONS?:  Please feel free to call your physician or the hospital operator if you have any questions, and they will be happy to assist you.

## 2011-12-03 ENCOUNTER — Other Ambulatory Visit: Payer: Self-pay | Admitting: Obstetrics & Gynecology

## 2011-12-03 ENCOUNTER — Encounter (HOSPITAL_COMMUNITY): Payer: Self-pay

## 2011-12-03 ENCOUNTER — Encounter (HOSPITAL_COMMUNITY)
Admission: RE | Admit: 2011-12-03 | Discharge: 2011-12-03 | Disposition: A | Discharge status: Home / Self Care | Payer: Medicaid Other | Source: Ambulatory Visit | Attending: Obstetrics & Gynecology | Admitting: Obstetrics & Gynecology

## 2011-12-03 LAB — COMPREHENSIVE METABOLIC PANEL
Albumin: 3.6 g/dL (ref 3.5–5.2)
BUN: 11 mg/dL (ref 6–23)
Calcium: 9.8 mg/dL (ref 8.4–10.5)
Chloride: 106 mEq/L (ref 96–112)
Creatinine, Ser: 1.06 mg/dL (ref 0.50–1.10)
Total Bilirubin: 0.2 mg/dL — ABNORMAL LOW (ref 0.3–1.2)

## 2011-12-03 LAB — URINALYSIS, ROUTINE W REFLEX MICROSCOPIC
Ketones, ur: NEGATIVE mg/dL
Leukocytes, UA: NEGATIVE
Nitrite: NEGATIVE
Protein, ur: NEGATIVE mg/dL
pH: 6 (ref 5.0–8.0)

## 2011-12-03 LAB — CBC
HCT: 38 % (ref 36.0–46.0)
MCH: 26.7 pg (ref 26.0–34.0)
MCHC: 31.6 g/dL (ref 30.0–36.0)
MCV: 84.4 fL (ref 78.0–100.0)
RDW: 14.1 % (ref 11.5–15.5)
WBC: 5.8 10*3/uL (ref 4.0–10.5)

## 2011-12-03 LAB — URINE MICROSCOPIC-ADD ON

## 2011-12-03 LAB — HCG, QUANTITATIVE, PREGNANCY: hCG, Beta Chain, Quant, S: <1 m[IU]/mL (ref <5)

## 2011-12-03 MED ORDER — KETOROLAC TROMETHAMINE 30 MG/ML IJ SOLN: 30.0000 mg | Freq: Once | INTRAMUSCULAR | Status: DC

## 2011-12-10 ENCOUNTER — Ambulatory Visit (HOSPITAL_COMMUNITY): Payer: Medicaid Other | Admitting: Anesthesiology

## 2011-12-10 ENCOUNTER — Encounter (HOSPITAL_COMMUNITY): Payer: Self-pay | Admitting: Anesthesiology

## 2011-12-10 ENCOUNTER — Ambulatory Visit (HOSPITAL_COMMUNITY)
Admission: RE | Admit: 2011-12-10 | Discharge: 2011-12-10 | Disposition: A | Discharge status: Home / Self Care | Payer: Medicaid Other | Source: Ambulatory Visit | Attending: Obstetrics & Gynecology | Admitting: Obstetrics & Gynecology

## 2011-12-10 ENCOUNTER — Encounter (HOSPITAL_COMMUNITY)
Admission: RE | Disposition: A | Discharge status: Home / Self Care | Payer: Self-pay | Source: Ambulatory Visit | Attending: Obstetrics & Gynecology

## 2011-12-10 ENCOUNTER — Encounter (HOSPITAL_COMMUNITY): Payer: Self-pay | Admitting: *Deleted

## 2011-12-10 DIAGNOSIS — Z90710 Acquired absence of both cervix and uterus: Secondary | ICD-10-CM

## 2011-12-10 DIAGNOSIS — N920 Excessive and frequent menstruation with regular cycle: Secondary | ICD-10-CM | POA: Insufficient documentation

## 2011-12-10 DIAGNOSIS — N946 Dysmenorrhea, unspecified: Secondary | ICD-10-CM | POA: Insufficient documentation

## 2011-12-10 HISTORY — PX: LAPAROSCOPIC SUPRACERVICAL HYSTERECTOMY: SHX5399

## 2011-12-10 HISTORY — PX: IUD REMOVAL: SHX5392

## 2011-12-10 LAB — TYPE AND SCREEN: ABO/RH(D): O POS

## 2011-12-10 SURGERY — HYSTERECTOMY, SUPRACERVICAL, LAPAROSCOPIC
Anesthesia: General | Wound class: Clean

## 2011-12-10 MED ORDER — NEOSTIGMINE METHYLSULFATE 1 MG/ML IJ SOLN
INTRAMUSCULAR | Status: DC | PRN
  Administered 2011-12-10: 3 mg via INTRAVENOUS

## 2011-12-10 MED ORDER — LIDOCAINE HCL (PF) 1 % IJ SOLN
INTRAMUSCULAR | Status: AC
  Filled 2011-12-10: qty 5

## 2011-12-10 MED ORDER — GLYCOPYRROLATE 0.2 MG/ML IJ SOLN
0.2000 mg | Freq: Once | INTRAMUSCULAR | Status: AC
  Administered 2011-12-10: 0.2 mg via INTRAVENOUS

## 2011-12-10 MED ORDER — FENTANYL CITRATE 0.05 MG/ML IJ SOLN
INTRAMUSCULAR | Status: DC | PRN
  Administered 2011-12-10 (×8): 50 ug via INTRAVENOUS

## 2011-12-10 MED ORDER — OXYCODONE-ACETAMINOPHEN 7.5-500 MG PO TABS: 1.0000 | ORAL_TABLET | Freq: Four times a day (QID) | ORAL | Status: AC | PRN

## 2011-12-10 MED ORDER — KETOROLAC TROMETHAMINE 30 MG/ML IJ SOLN
INTRAMUSCULAR | Status: AC
  Filled 2011-12-10: qty 1

## 2011-12-10 MED ORDER — PROPOFOL 10 MG/ML IV BOLUS
INTRAVENOUS | Status: DC | PRN
  Administered 2011-12-10: 200 mg via INTRAVENOUS

## 2011-12-10 MED ORDER — CEFAZOLIN SODIUM-DEXTROSE 2-3 GM-% IV SOLR
2.0000 g | INTRAVENOUS | Status: AC
  Administered 2011-12-10: 2 g via INTRAVENOUS

## 2011-12-10 MED ORDER — ONDANSETRON HCL 4 MG/2ML IJ SOLN
4.0000 mg | Freq: Once | INTRAMUSCULAR | Status: AC
Start: 2011-12-10 — End: 2011-12-10
  Administered 2011-12-10: 4 mg via INTRAVENOUS

## 2011-12-10 MED ORDER — FENTANYL CITRATE 0.05 MG/ML IJ SOLN
INTRAMUSCULAR | Status: AC
  Filled 2011-12-10: qty 5

## 2011-12-10 MED ORDER — GLYCOPYRROLATE 0.2 MG/ML IJ SOLN
INTRAMUSCULAR | Status: AC
  Filled 2011-12-10: qty 2

## 2011-12-10 MED ORDER — GLYCOPYRROLATE 0.2 MG/ML IJ SOLN
INTRAMUSCULAR | Status: AC
  Filled 2011-12-10: qty 1

## 2011-12-10 MED ORDER — ROCURONIUM BROMIDE 50 MG/5ML IV SOLN
INTRAVENOUS | Status: AC
  Filled 2011-12-10: qty 1

## 2011-12-10 MED ORDER — FENTANYL CITRATE 0.05 MG/ML IJ SOLN
25.0000 ug | INTRAMUSCULAR | Status: DC | PRN
  Administered 2011-12-10: 50 ug via INTRAVENOUS

## 2011-12-10 MED ORDER — MIDAZOLAM HCL 2 MG/2ML IJ SOLN
INTRAMUSCULAR | Status: AC
  Filled 2011-12-10: qty 2

## 2011-12-10 MED ORDER — GLYCOPYRROLATE 0.2 MG/ML IJ SOLN
INTRAMUSCULAR | Status: DC | PRN
  Administered 2011-12-10: .5 mg via INTRAVENOUS

## 2011-12-10 MED ORDER — ONDANSETRON HCL 4 MG/2ML IJ SOLN
4.0000 mg | Freq: Once | INTRAMUSCULAR | Status: AC | PRN
  Administered 2011-12-10: 4 mg via INTRAVENOUS

## 2011-12-10 MED ORDER — ROCURONIUM BROMIDE 100 MG/10ML IV SOLN
INTRAVENOUS | Status: DC | PRN
  Administered 2011-12-10: 50 mg via INTRAVENOUS
  Administered 2011-12-10: 10 mg via INTRAVENOUS
  Administered 2011-12-10: 50 mg via INTRAVENOUS
  Administered 2011-12-10: 10 mg via INTRAVENOUS

## 2011-12-10 MED ORDER — CEFAZOLIN SODIUM-DEXTROSE 2-3 GM-% IV SOLR
INTRAVENOUS | Status: AC
  Filled 2011-12-10: qty 50

## 2011-12-10 MED ORDER — LIDOCAINE HCL 1 % IJ SOLN
INTRAMUSCULAR | Status: DC | PRN
  Administered 2011-12-10: 50 mg via INTRADERMAL

## 2011-12-10 MED ORDER — ONDANSETRON HCL 4 MG/2ML IJ SOLN
INTRAMUSCULAR | Status: AC
  Filled 2011-12-10: qty 2

## 2011-12-10 MED ORDER — FENTANYL CITRATE 0.05 MG/ML IJ SOLN
INTRAMUSCULAR | Status: AC
  Filled 2011-12-10: qty 2

## 2011-12-10 MED ORDER — SODIUM CHLORIDE 0.9 % IR SOLN
Status: DC | PRN
  Administered 2011-12-10: 1

## 2011-12-10 MED ORDER — SODIUM CHLORIDE 0.9 % IR SOLN
Status: DC | PRN
  Administered 2011-12-10: 3000 mL

## 2011-12-10 MED ORDER — LACTATED RINGERS IV SOLN
INTRAVENOUS | Status: DC
  Administered 2011-12-10: 1000 mL via INTRAVENOUS
  Administered 2011-12-10: 10:00:00 via INTRAVENOUS

## 2011-12-10 MED ORDER — NEOSTIGMINE METHYLSULFATE 1 MG/ML IJ SOLN
INTRAMUSCULAR | Status: AC
  Filled 2011-12-10: qty 10

## 2011-12-10 MED ORDER — KETOROLAC TROMETHAMINE 10 MG PO TABS: 10.0000 mg | ORAL_TABLET | Freq: Three times a day (TID) | ORAL | Status: AC | PRN

## 2011-12-10 MED ORDER — MIDAZOLAM HCL 2 MG/2ML IJ SOLN
1.0000 mg | INTRAMUSCULAR | Status: DC | PRN
  Administered 2011-12-10: 2 mg via INTRAVENOUS

## 2011-12-10 MED ORDER — KETOROLAC TROMETHAMINE 30 MG/ML IJ SOLN
30.0000 mg | Freq: Once | INTRAMUSCULAR | Status: AC
  Administered 2011-12-10: 30 mg via INTRAVENOUS

## 2011-12-10 MED ORDER — PROPOFOL 10 MG/ML IV EMUL
INTRAVENOUS | Status: AC
  Filled 2011-12-10: qty 20

## 2011-12-10 MED ORDER — ONDANSETRON HCL 8 MG PO TABS: 8.0000 mg | ORAL_TABLET | Freq: Three times a day (TID) | ORAL | Status: AC | PRN

## 2011-12-10 SURGICAL SUPPLY — 41 items
APPLIER CLIP UNV 5X34 EPIX (ENDOMECHANICALS) ×2 IMPLANT
BAG HAMPER (MISCELLANEOUS) ×2 IMPLANT
BLADE LAPAROSCOPIC MORCELL KIT (BLADE) ×2 IMPLANT
BLADE SURG SZ11 CARB STEEL (BLADE) ×2 IMPLANT
CLOTH BEACON ORANGE TIMEOUT ST (SAFETY) ×2 IMPLANT
COVER LIGHT HANDLE STERIS (MISCELLANEOUS) ×4 IMPLANT
DRAPE PROXIMA HALF (DRAPES) ×2 IMPLANT
ELECT REM PT RETURN 9FT ADLT (ELECTROSURGICAL) ×2
ELECTRODE REM PT RTRN 9FT ADLT (ELECTROSURGICAL) ×1 IMPLANT
FILTER SMOKE EVAC LAPAROSHD (FILTER) ×2 IMPLANT
FORMALIN 10 PREFIL 480ML (MISCELLANEOUS) ×2 IMPLANT
GLOVE BIOGEL PI IND STRL 8 (GLOVE) ×1 IMPLANT
GLOVE BIOGEL PI INDICATOR 8 (GLOVE) ×1
GLOVE ECLIPSE 8.0 STRL XLNG CF (GLOVE) ×2 IMPLANT
GLOVE INDICATOR 6.5 STRL GRN (GLOVE) ×2 IMPLANT
GLOVE INDICATOR 7.0 STRL GRN (GLOVE) ×6 IMPLANT
GLOVE SS BIOGEL STRL SZ 6.5 (GLOVE) ×1 IMPLANT
GLOVE SUPERSENSE BIOGEL SZ 6.5 (GLOVE) ×1
GOWN PREVENTION PLUS XLARGE (GOWN DISPOSABLE) ×4 IMPLANT
GOWN STRL REIN XL XLG (GOWN DISPOSABLE) ×2 IMPLANT
INST SET LAPROSCOPIC GYN AP (KITS) ×2 IMPLANT
IV NS IRRIG 3000ML ARTHROMATIC (IV SOLUTION) ×2 IMPLANT
KIT ROOM TURNOVER APOR (KITS) ×2 IMPLANT
MANIFOLD NEPTUNE II (INSTRUMENTS) ×2 IMPLANT
NEEDLE INSUFFLATION 14GA 120MM (NEEDLE) ×2 IMPLANT
PACK PERI GYN (CUSTOM PROCEDURE TRAY) ×2 IMPLANT
PAD ARMBOARD 7.5X6 YLW CONV (MISCELLANEOUS) ×4 IMPLANT
SCALPEL HARMONIC ACE (MISCELLANEOUS) ×2 IMPLANT
SET BASIN LINEN APH (SET/KITS/TRAYS/PACK) ×2 IMPLANT
SET TUBE IRRIG SUCTION NO TIP (IRRIGATION / IRRIGATOR) ×2 IMPLANT
SOLUTION ANTI FOG 6CC (MISCELLANEOUS) ×2 IMPLANT
SPONGE GAUZE 2X2 8PLY STRL LF (GAUZE/BANDAGES/DRESSINGS) ×6 IMPLANT
STAPLER VISISTAT 35W (STAPLE) ×2 IMPLANT
SUT VICRYL 0 UR6 27IN ABS (SUTURE) ×2 IMPLANT
SYRINGE 10CC LL (SYRINGE) ×2 IMPLANT
TAPE CLOTH SURG 4X10 WHT LF (GAUZE/BANDAGES/DRESSINGS) ×2 IMPLANT
TRAY FOLEY CATH 14FR (SET/KITS/TRAYS/PACK) ×2 IMPLANT
TROCAR Z-THRD FIOS HNDL 11X100 (TROCAR) ×2 IMPLANT
TROCAR Z-THREAD SLEEVE 11X100 (TROCAR) ×4 IMPLANT
TUBING HI FLO HEAT INSUFFLATOR (IRRIGATION / IRRIGATOR) ×2 IMPLANT
WARMER LAPAROSCOPE (MISCELLANEOUS) ×2 IMPLANT

## 2011-12-10 NOTE — Progress Notes (Addendum)
States she feels her breathing is normal since arrival in post op sitting up in chair. States she thinks the breathing episodes she had in recovery room were "panic attacks".

## 2011-12-10 NOTE — Anesthesia Preprocedure Evaluation (Addendum)
Anesthesia Evaluation  Patient identified by MRN, date of birth, ID band Patient awake    Reviewed: Allergy & Precautions, H&P , NPO status , Patient's Chart, lab work & pertinent test results  History of Anesthesia Complications Negative for: history of anesthetic complications  Airway Mallampati: I      Dental  (+) Teeth Intact   Pulmonary neg pulmonary ROS,  breath sounds clear to auscultation        Cardiovascular negative cardio ROS  Rhythm:Regular Rate:Normal     Neuro/Psych    GI/Hepatic negative GI ROS,   Endo/Other    Renal/GU      Musculoskeletal   Abdominal   Peds  Hematology   Anesthesia Other Findings   Reproductive/Obstetrics                           Anesthesia Physical Anesthesia Plan  ASA: I  Anesthesia Plan: General   Post-op Pain Management:    Induction: Intravenous  Airway Management Planned: Oral ETT  Additional Equipment:   Intra-op Plan:   Post-operative Plan: Extubation in OR  Informed Consent: I have reviewed the patients History and Physical, chart, labs and discussed the procedure including the risks, benefits and alternatives for the proposed anesthesia with the patient or authorized representative who has indicated his/her understanding and acceptance.     Plan Discussed with:   Anesthesia Plan Comments:         Anesthesia Quick Evaluation  

## 2011-12-10 NOTE — Progress Notes (Signed)
Dr. Jayme Cloud notified pt's c/o SOB.  VS remain WNL, No new orders received.  Will cont to monitor.

## 2011-12-10 NOTE — Transfer of Care (Signed)
Immediate Anesthesia Transfer of Care Note  Patient: Emma Huber  Procedure(s) Performed: Procedure(s) (LRB): LAPAROSCOPIC SUPRACERVICAL HYSTERECTOMY (N/A) INTRAUTERINE DEVICE (IUD) REMOVAL (N/A)  Patient Location: PACU  Anesthesia Type: General  Level of Consciousness: awake, alert  and oriented  Airway & Oxygen Therapy: Patient Spontanous Breathing and Patient connected to face mask oxygen  Post-op Assessment: Report given to PACU RN  Post vital signs: Reviewed and stable  Complications: No apparent anesthesia complications

## 2011-12-10 NOTE — Op Note (Signed)
Preoperative diagnosis:  1.  Menometerorrhagia                                         2.  Dysmenorrhea                                         3.  Failed attempt at endometrial ablation                                         4.  Failed conservative management  Postoperative diagnoses:  Same as above   Procedure:  Laparoscopic supracervical hysterectomy   Surgeon:  Lazaro Arms, MD  Assistant:    Anesthesia:  Gen. Endotracheal  Findings: Probable adenomyosis, no definitive fibroids.  Normal ovaries tubes and peritoneal surfaces  Description of operation:  The patient was taken to the operating room and placed in the supine position where she underwent general endotracheal anesthesia. The patient was then placed in the low lithotomy position and was prepped and draped in the usual sterile fashion. A Foley catheter was placed. An incision was made in the umbilicus.  A Veres needle was used and peritoneal insufflation was performed.  An 11 mm non-bladed trocar was placed into the peritoneal cavity with one pass under direct visualization without difficulty.  Incisions were then made in the right and left lower quadrant and 11 mm trochars were placed again under direct visualization without difficulty.  The right adnexa was grasped and the harmonic scalpel was used to coagulate and transect the right round ligament. The right utero ovarian ligament was also coagulated and transected with good hemostasis. The uterine vessels were skeletonized and the peritoneum was reflected off the lower uterine segment.  The left uterine cornu was then grasped and the left round ligament was coagulated and transected with the harmonic scalpel the uterine ovarian ligament was then transected and coagulated. The left uterine vessels were skeletonized the peritoneal reflection was also taken down off the lower uterine segment without difficulty.  Hemoclips were then placed just below the level of the internal os  bilaterally to prevent backbleeding. The uterine vessels were then coagulated and transected bilaterally using the harmonic scalpel. There was good hemostasis. 2 pedicles were then taken down the cardinal ligament medial to the transected uterine vessel pedicle and the cervix was transected at this level using the harmonic scalpel on low setting.  All pedicles were hemostatic and the specimen was freely transected from the cervix.  The uterine morcellator was then placed in the left lower quadrant and the uterus, and portion of cervix were removed in strips using the morcellator. This was done safely under direct visualization.  The pelvis was irrigated and all pedicles found to be hemostatic. Additional fluid was left inside the peritoneum to prevent her diminish future adhesion formation. The ovaries were normal and, as per preoperative plan, left in place.  All 3 incisions were closed for a peritoneum and fascia closed with single sutures using 0 Vicryl. Subcutaneous tissue was also closed using 0 Vicryl and the skin was closed using skin staples. The patient tolerated the procedure well she experienced approximately 100 cc of blood loss. She received 2 g of  Ancef and 30 mg of Toradol preoperatively prophylactically.  She was awakened from anesthesia taken to the recovery room in good stable condition with all counts being CORRECT x3.  EURE,LUTHER H 12/10/2011, 10:29 AM

## 2011-12-10 NOTE — H&P (Signed)
Emma Huber is an 31 y.o. female status post an hysteroscopy curettage with attempted endometrial ablation last November who has continued to have heavy and painful bleeding, mostly pain. The ablation could not be performed because the endometrial cavity was too pliable and used too much volume and could not even start the heating cycle. I put her back on megace which has not provided relief from either.  I also placed a Mirena IUD which again has been unsuccessful.    The patient, after several months of attempted conservative management, is opting for a laparoscopic supracervical hysterectomy with preservation of cervix and ovaries.     No past medical history on file.  Past Surgical History  Procedure Date  . Tubal ligation 9 yrs ago    [facility]  . Hysteroscopy w/d&c 03/12/2011    Procedure: DILATATION AND CURETTAGE (D&C) /HYSTEROSCOPY;  Surgeon: Lazaro Arms, MD;  Location: [facility];  Service: Gynecology;  Laterality: N/A;  . Endometrial ablation 03/12/2011    Procedure: ENDOMETRIAL ABLATION;  Surgeon: Lazaro Arms, MD;  Location: [facility];  Service: Gynecology;  Laterality: N/A;  attempted ablation    unsuccessful  Mirena IUD inserted Lot# [id number]   EXP-06/26/2013    Family History  Problem Relation Age of Onset  . Anesthesia problems Neg Hx   . Hypotension Neg Hx   . Malignant hyperthermia Neg Hx   . Pseudochol deficiency Neg Hx     Social History:  reports that she has been smoking Cigarettes.  She has a 1.75 pack-year smoking history. She does not have any smokeless tobacco history on file. She reports that she drinks alcohol. She reports that she does not use illicit drugs.  Allergies: No Known Allergies  Prescriptions prior to admission  Medication Sig Dispense Refill  . loratadine (CLARITIN) 10 MG tablet Take 10 mg by mouth daily.      . megestrol (MEGACE) 40 MG tablet Take 40 mg by mouth daily.      . naphazoline (CLEAR EYES) 0.012 % ophthalmic solution Place 1 drop into  both eyes 4 (four) times daily as needed. Eye Irritation      . ranitidine (ZANTAC) 150 MG tablet Take 150 mg by mouth 2 (two) times daily.      Marland Kitchen levonorgestrel (MIRENA) 20 MCG/24HR IUD 1 each by Intrauterine route once.        ROS  Review of Systems  Constitutional: Negative for fever, chills, weight loss, malaise/fatigue and diaphoresis.  HENT: Negative for hearing loss, ear pain, nosebleeds, congestion, sore throat, neck pain, tinnitus and ear discharge.   Eyes: Negative for blurred vision, double vision, photophobia, pain, discharge and redness.  Respiratory: Negative for cough, hemoptysis, sputum production, shortness of breath, wheezing and stridor.   Cardiovascular: Negative for chest pain, palpitations, orthopnea, claudication, leg swelling and PND.  Gastrointestinal: Positive for abdominal pain. Negative for heartburn, nausea, vomiting, diarrhea, constipation, blood in stool and melena.  Genitourinary: Negative for dysuria, urgency, frequency, hematuria and flank pain.  Musculoskeletal: Negative for myalgias, back pain, joint pain and falls.  Skin: Negative for itching and rash.  Neurological: Negative for dizziness, tingling, tremors, sensory change, speech change, focal weakness, seizures, loss of consciousness, weakness and headaches.  Endo/Heme/Allergies: Negative for environmental allergies and polydipsia. Does not bruise/bleed easily.  Psychiatric/Behavioral: Negative for depression, suicidal ideas, hallucinations, memory loss and substance abuse. The patient is not nervous/anxious and does not have insomnia.      Blood pressure 114/73, pulse 79, temperature 98.3 F (36.8  C), temperature source Oral, resp. rate 18, SpO2 97.00%. Physical Exam Physical Exam  Vitals reviewed. Constitutional: She is oriented to person, place, and time. She appears well-developed and well-nourished.  HENT:  Head: Normocephalic and atraumatic.  Right Ear: External ear normal.  Left Ear:  External ear normal.  Nose: Nose normal.  Mouth/Throat: Oropharynx is clear and moist.  Eyes: Conjunctivae and EOM are normal. Pupils are equal, round, and reactive to light. Right eye exhibits no discharge. Left eye exhibits no discharge. No scleral icterus.  Neck: Normal range of motion. Neck supple. No tracheal deviation present. No thyromegaly present.  Cardiovascular: Normal rate, regular rhythm, normal heart sounds and intact distal pulses.  Exam reveals no gallop and no friction rub.   No murmur heard. Respiratory: Effort normal and breath sounds normal. No respiratory distress. She has no wheezes. She has no rales. She exhibits no tenderness.  GI: Soft. Bowel sounds are normal. She exhibits no distension and no mass. There is tenderness. There is no rebound and no guarding.  Genitourinary:       Vulva is normal without lesions Vagina is pink moist without discharge Cervix normal in appearance and pap is normal Uterus is normal sized with small clinically insignificant fibroids Adnexa is negative with normal sized ovaries by sonogram  Musculoskeletal: Normal range of motion. She exhibits no edema and no tenderness.  Neurological: She is alert and oriented to person, place, and time. She has normal reflexes. She displays normal reflexes. No cranial nerve deficit. She exhibits normal muscle tone. Coordination normal.  Skin: Skin is warm and dry. No rash noted. No erythema. No pallor.  Psychiatric: She has a normal mood and affect. Her behavior is normal. Judgment and thought content normal.    Recent Results (from the past 336 hour(s))  SURGICAL PCR SCREEN   Collection Time   12/03/11 10:50 AM      Component Value Range   MRSA, PCR NEGATIVE  NEGATIVE   Staphylococcus aureus POSITIVE (*) NEGATIVE  URINALYSIS, ROUTINE W REFLEX MICROSCOPIC   Collection Time   12/03/11 10:50 AM      Component Value Range   Color, Urine YELLOW  YELLOW   APPearance CLEAR  CLEAR   Specific Gravity, Urine  1.005  1.005 - 1.030   pH 6.0  5.0 - 8.0   Glucose, UA NEGATIVE  NEGATIVE mg/dL   Hgb urine dipstick LARGE (*) NEGATIVE   Bilirubin Urine NEGATIVE  NEGATIVE   Ketones, ur NEGATIVE  NEGATIVE mg/dL   Protein, ur NEGATIVE  NEGATIVE mg/dL   Urobilinogen, UA 0.2  0.0 - 1.0 mg/dL   Nitrite NEGATIVE  NEGATIVE   Leukocytes, UA NEGATIVE  NEGATIVE  URINE MICROSCOPIC-ADD ON   Collection Time   12/03/11 10:50 AM      Component Value Range   RBC / HPF 7-10  <3 RBC/hpf  CBC   Collection Time   12/03/11 11:05 AM      Component Value Range   WBC 5.8  4.0 - 10.5 K/uL   RBC 4.50  3.87 - 5.11 MIL/uL   Hemoglobin 12.0  12.0 - 15.0 g/dL   HCT 16.1  09.6 - 04.5 %   MCV 84.4  78.0 - 100.0 fL   MCH 26.7  26.0 - 34.0 pg   MCHC 31.6  30.0 - 36.0 g/dL   RDW 40.9  81.1 - 91.4 %   Platelets 288  150 - 400 K/uL  COMPREHENSIVE METABOLIC PANEL   Collection Time  12/03/11 11:05 AM      Component Value Range   Sodium 140  135 - 145 mEq/L   Potassium 4.0  3.5 - 5.1 mEq/L   Chloride 106  96 - 112 mEq/L   CO2 24  19 - 32 mEq/L   Glucose, Bld 90  70 - 99 mg/dL   BUN 11  6 - 23 mg/dL   Creatinine, Ser 2.95  0.50 - 1.10 mg/dL   Calcium 9.8  8.4 - 28.4 mg/dL   Total Protein 7.6  6.0 - 8.3 g/dL   Albumin 3.6  3.5 - 5.2 g/dL   AST 19  0 - 37 U/L   ALT 16  0 - 35 U/L   Alkaline Phosphatase 56  39 - 117 U/L   Total Bilirubin 0.2 (*) 0.3 - 1.2 mg/dL   GFR calc non Af Amer 69 (*) >90 mL/min   GFR calc Af Amer 80 (*) >90 mL/min  HCG, QUANTITATIVE, PREGNANCY   Collection Time   12/03/11 11:05 AM      Component Value Range   hCG, Beta Chain, Quant, S <1  <5 mIU/mL  TYPE AND SCREEN   Collection Time   12/10/11  7:15 AM      Component Value Range   ABO/RH(D) O POS     Antibody Screen PENDING     Sample Expiration 12/13/2011        Results for orders placed during the hospital encounter of 12/10/11 (from the past 24 hour(s))  TYPE AND SCREEN     Status: Normal (Preliminary result)   Collection Time   12/10/11   7:15 AM      Component Value Range   ABO/RH(D) O POS     Antibody Screen PENDING     Sample Expiration 12/13/2011        Assessment/Plan: 1.  Menometrorrhagia 2.  Dysmenorrhea 3.  Unresponsive to all conservative measures   Proceed with laparoscopic hysterectomy supracervical.  Pt understands the risks of surgery including but not limited t  excessive bleeding requiring transfusion or reoperation, post-operative infection requiring prolonged hospitalization or re-hospitalization and antibiotic therapy, and damage to other organs including bladder, bowel, ureters and major vessels.  The patient also understands the alternative treatment options which were discussed in full.  All questions were answered.   EURE,LUTHER H 12/10/2011, 8:23 AM

## 2011-12-10 NOTE — Progress Notes (Signed)
Discharged to car via wc in good condition. Denies any difficulty breathing. States she feels relaxed and is ready to go home now.

## 2011-12-10 NOTE — Anesthesia Postprocedure Evaluation (Signed)
  Anesthesia Post-op Note  Patient: Emma Huber  Procedure(s) Performed: Procedure(s) (LRB): LAPAROSCOPIC SUPRACERVICAL HYSTERECTOMY (N/A) INTRAUTERINE DEVICE (IUD) REMOVAL (N/A)  Patient Location: PACU  Anesthesia Type: General  Level of Consciousness: awake, alert  and oriented  Airway and Oxygen Therapy: Patient Spontanous Breathing and Patient connected to face mask oxygen  Post-op Pain: mild  Post-op Assessment: Post-op Vital signs reviewed, Patient's Cardiovascular Status Stable, Respiratory Function Stable, Patent Airway and No signs of Nausea or vomiting  Post-op Vital Signs: Reviewed and stable  Complications: No apparent anesthesia complications

## 2011-12-10 NOTE — Anesthesia Procedure Notes (Signed)
Procedure Name: Intubation Date/Time: 12/10/2011 8:58 AM Performed by: [name] E Pre-anesthesia Checklist: Patient identified, Patient being monitored, Timeout performed, Emergency Drugs available and Suction available Patient Re-evaluated:Patient Re-evaluated prior to inductionOxygen Delivery Method: Circle System Utilized Preoxygenation: Pre-oxygenation with 100% oxygen Intubation Type: IV induction Ventilation: Mask ventilation without difficulty Laryngoscope Size: Mac and 3 Grade View: Grade I Tube type: Oral Tube size: 7.0 mm Number of attempts: 1 Airway Equipment and Method: stylet Placement Confirmation: ETT inserted through vocal cords under direct vision,  positive ETCO2 and breath sounds checked- equal and bilateral Secured at: 21 cm Tube secured with: Tape Dental Injury: Teeth and Oropharynx as per pre-operative assessment

## 2011-12-10 NOTE — Progress Notes (Signed)
Foley dc'd per protocol, 10mL saline aspirated from balloon.  Pt tol well, will cont to monitor.

## 2011-12-10 NOTE — Addendum Note (Signed)
Addendum  created 12/10/11 1239 by Moshe Salisbury, CRNA   Modules edited:Charges VN

## 2011-12-11 NOTE — Addendum Note (Signed)
Addendum  created 12/11/11 1006 by Despina Hidden, CRNA   Modules edited:Notes Section

## 2011-12-11 NOTE — Anesthesia Postprocedure Evaluation (Signed)
  Anesthesia Post-op Note  Patient: Emma Huber  Procedure(s) Performed: Procedure(s) (LRB): LAPAROSCOPIC SUPRACERVICAL HYSTERECTOMY (N/A) INTRAUTERINE DEVICE (IUD) REMOVAL (N/A)  Patient Location: 3A  Anesthesia Type: General  Level of Consciousness: awake, alert , oriented and patient cooperative  Airway and Oxygen Therapy: Patient Spontanous Breathing  Post-op Pain: none  Post-op Assessment: Post-op Vital signs reviewed, Patient's Cardiovascular Status Stable, Respiratory Function Stable, Patent Airway, No signs of Nausea or vomiting, Adequate PO intake and Pain level controlled  Post-op Vital Signs: Reviewed and stable  Complications: No apparent anesthesia complications

## 2011-12-15 ENCOUNTER — Encounter (HOSPITAL_COMMUNITY): Payer: Self-pay | Admitting: Obstetrics & Gynecology

## 2012-01-04 ENCOUNTER — Emergency Department (HOSPITAL_COMMUNITY): Payer: Medicaid Other

## 2012-01-04 ENCOUNTER — Emergency Department (HOSPITAL_COMMUNITY)
Admission: EM | Admit: 2012-01-04 | Discharge: 2012-01-04 | Disposition: A | Discharge status: Home / Self Care | Payer: Medicaid Other | Attending: Emergency Medicine | Admitting: Emergency Medicine

## 2012-01-04 ENCOUNTER — Encounter (HOSPITAL_COMMUNITY): Payer: Self-pay | Admitting: *Deleted

## 2012-01-04 DIAGNOSIS — R071 Chest pain on breathing: Secondary | ICD-10-CM | POA: Insufficient documentation

## 2012-01-04 DIAGNOSIS — R0789 Other chest pain: Secondary | ICD-10-CM

## 2012-01-04 DIAGNOSIS — Y9389 Activity, other specified: Secondary | ICD-10-CM | POA: Insufficient documentation

## 2012-01-04 DIAGNOSIS — T148XXA Other injury of unspecified body region, initial encounter: Secondary | ICD-10-CM

## 2012-01-04 DIAGNOSIS — X500XXA Overexertion from strenuous movement or load, initial encounter: Secondary | ICD-10-CM | POA: Insufficient documentation

## 2012-01-04 DIAGNOSIS — Y990 Civilian activity done for income or pay: Secondary | ICD-10-CM | POA: Insufficient documentation

## 2012-01-04 DIAGNOSIS — Y9289 Other specified places as the place of occurrence of the external cause: Secondary | ICD-10-CM | POA: Insufficient documentation

## 2012-01-04 MED ORDER — HYDROCODONE-ACETAMINOPHEN 5-325 MG PO TABS: 1.0000 | ORAL_TABLET | Freq: Four times a day (QID) | ORAL | Status: AC | PRN

## 2012-01-04 MED ORDER — IBUPROFEN 800 MG PO TABS
800.0000 mg | ORAL_TABLET | Freq: Once | ORAL | Status: AC
  Administered 2012-01-04: 800 mg via ORAL
  Filled 2012-01-04: qty 1

## 2012-01-04 MED ORDER — CYCLOBENZAPRINE HCL 10 MG PO TABS: ORAL_TABLET | ORAL | Status: DC

## 2012-01-04 MED ORDER — CYCLOBENZAPRINE HCL 10 MG PO TABS
10.0000 mg | ORAL_TABLET | Freq: Once | ORAL | Status: AC
  Administered 2012-01-04: 10 mg via ORAL
  Filled 2012-01-04: qty 1

## 2012-01-04 MED ORDER — HYDROCODONE-ACETAMINOPHEN 5-325 MG PO TABS
1.0000 | ORAL_TABLET | Freq: Once | ORAL | Status: AC
  Administered 2012-01-04: 1 via ORAL
  Filled 2012-01-04: qty 1

## 2012-01-04 NOTE — ED Provider Notes (Signed)
Medical screening examination/treatment/procedure(s) were performed by non-physician practitioner and as supervising physician I was immediately available for consultation/collaboration.   Jonda Alanis M Rupinder Livingston, MD 01/04/12 1518 

## 2012-01-04 NOTE — ED Notes (Signed)
Pt c/o lower chest pain, upper abd pain that radiates around to back area, worse with movement, breathing, pain started suddenly Friday evening, has gotten worse since then, pt also reports that she may  Be concerned that she has "done too much" pt had partial hysterectomy on august 14, 20113, pt denies n/v, fever.

## 2012-01-04 NOTE — ED Provider Notes (Signed)
History     CSN: [id number]  Arrival date & time 01/04/12  1026   First MD Initiated Contact with Patient 01/04/12 1030      Chief Complaint  Patient presents with  . Chest Pain  . Back Pain    (Consider location/radiation/quality/duration/timing/severity/associated sxs/prior treatment) HPI Comments: States she has had gradually worsening pain in upper chest and upper back for the past 2 days.  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  She does not recall any specific movement which caused her pain.  She has also had a mild NP cough.  No fever or chills and no SOB.  The history is provided by the patient. No language interpreter was used.    History reviewed. No pertinent past medical history.  Past Surgical History  Procedure Date  . Tubal ligation 9 yrs ago    [facility]  . Hysteroscopy w/d&c 03/12/2011    Procedure: DILATATION AND CURETTAGE (D&C) /HYSTEROSCOPY;  Surgeon: Lazaro Arms, MD;  Location: [facility];  Service: Gynecology;  Laterality: N/A;  . Endometrial ablation 03/12/2011    Procedure: ENDOMETRIAL ABLATION;  Surgeon: Lazaro Arms, MD;  Location: [facility];  Service: Gynecology;  Laterality: N/A;  attempted ablation    unsuccessful  Mirena IUD inserted Lot# [id number]   EXP-06/26/2013  . Laparoscopic supracervical hysterectomy 12/10/2011    Procedure: LAPAROSCOPIC SUPRACERVICAL HYSTERECTOMY;  Surgeon: Lazaro Arms, MD;  Location: [facility];  Service: Gynecology;  Laterality: N/A;  . Iud removal 12/10/2011    Procedure: INTRAUTERINE DEVICE (IUD) REMOVAL;  Surgeon: Lazaro Arms, MD;  Location: [facility];  Service: Gynecology;  Laterality: N/A;  . Partial hysterectomy     Family History  Problem Relation Age of Onset  . Anesthesia problems Neg Hx   . Hypotension Neg Hx   . Malignant hyperthermia Neg Hx   . Pseudochol deficiency Neg Hx     History  Substance Use Topics  . Smoking status: Current Some Day Smoker  -- 0.2 packs/day for 7 years    Types: Cigarettes  . Smokeless tobacco: Not on file  . Alcohol Use: Yes     occasionally    OB History    Grav Para Term Preterm Abortions TAB SAB Ect Mult Living                  Review of Systems  Constitutional: Negative for fever and chills.  Respiratory: Positive for cough. Negative for shortness of breath and wheezing.   Cardiovascular: Positive for chest pain.  Musculoskeletal: Positive for back pain.  All other systems reviewed and are negative.    Allergies  Review of patient's allergies indicates no known allergies.  Home Medications   Current Outpatient Rx  Name Route Sig Dispense Refill  . ACETAMINOPHEN 500 MG PO TABS Oral Take 1,000-1,500 mg by mouth every 6 (six) hours as needed. Pain.    . IBUPROFEN 200 MG PO TABS Oral Take 800 mg by mouth every 6 (six) hours as needed. Pain.    Marland Kitchen LORATADINE 10 MG PO TABS Oral Take 10 mg by mouth daily.    Marland Kitchen NAPHAZOLINE HCL 0.012 % OP SOLN Both Eyes Place 1 drop into both eyes 4 (four) times daily as needed. Eye Irritation    . RANITIDINE HCL 150 MG PO TABS Oral Take 150 mg by mouth 2 (two) times daily.    . CYCLOBENZAPRINE HCL 10 MG PO TABS  1/2 to one tab po TID 20 tablet 0  . HYDROCODONE-ACETAMINOPHEN 5-325 MG PO TABS Oral Take 1 tablet by mouth every 6 (six) hours as needed for pain. 20 tablet 0    BP 112/94  Pulse 84  Temp 98.2 F (36.8 C)  Resp 16  Ht 5\' 7"  (1.702 m)  Wt 240 lb (108.863 kg)  BMI 37.59 kg/m2  SpO2 99%  LMP 11/26/2011  Physical Exam  Nursing note and vitals reviewed. Constitutional: She is oriented to person, place, and time. She appears well-developed and well-nourished. No distress.  HENT:  Head: Normocephalic and atraumatic.  Eyes: EOM are normal.  Neck: Normal range of motion.  Cardiovascular: Normal rate, regular rhythm and normal heart sounds.   Pulmonary/Chest: Effort normal and breath sounds normal.   She exhibits tenderness.    Abdominal: Soft.  She exhibits no distension. There is no tenderness.  Musculoskeletal: Normal range of motion. She exhibits tenderness.  Neurological: She is alert and oriented to person, place, and time.  Skin: Skin is warm and dry.  Psychiatric: She has a normal mood and affect. Judgment normal.    ED Course  Procedures (including critical care time)  Labs Reviewed - No data to display Dg Chest 2 View  01/04/2012  *RADIOLOGY REPORT*  Clinical Data: Chest and back pain.  CHEST - 2 VIEW  Comparison: No priors.  Findings: Lung volumes are normal.  No consolidative airspace disease.  No pleural effusions.  No pneumothorax.  No pulmonary nodule or mass noted.  Pulmonary vasculature and the cardiomediastinal silhouette are within normal limits.  IMPRESSION: 1. No radiographic evidence of acute cardiopulmonary disease.   Original Report Authenticated By: Florencia Reasons, M.D.      1. Chest wall pain   2. Muscle strain       MDM  rx-flexeril, 20 rx-hydrocodone, 20 Ibuprofen 800 mg        Evalina Field, [place] 01/04/12 1205

## 2012-06-18 ENCOUNTER — Emergency Department (HOSPITAL_COMMUNITY): Payer: Medicaid Other

## 2012-06-18 ENCOUNTER — Emergency Department (HOSPITAL_COMMUNITY)
Admission: EM | Admit: 2012-06-18 | Discharge: 2012-06-18 | Disposition: A | Discharge status: Home / Self Care | Payer: Medicaid Other | Attending: Emergency Medicine | Admitting: Emergency Medicine

## 2012-06-18 ENCOUNTER — Encounter (HOSPITAL_COMMUNITY): Payer: Self-pay | Admitting: *Deleted

## 2012-06-18 DIAGNOSIS — Z79899 Other long term (current) drug therapy: Secondary | ICD-10-CM | POA: Insufficient documentation

## 2012-06-18 DIAGNOSIS — R0789 Other chest pain: Secondary | ICD-10-CM

## 2012-06-18 DIAGNOSIS — R071 Chest pain on breathing: Secondary | ICD-10-CM | POA: Insufficient documentation

## 2012-06-18 DIAGNOSIS — K219 Gastro-esophageal reflux disease without esophagitis: Secondary | ICD-10-CM | POA: Insufficient documentation

## 2012-06-18 DIAGNOSIS — F17200 Nicotine dependence, unspecified, uncomplicated: Secondary | ICD-10-CM | POA: Insufficient documentation

## 2012-06-18 DIAGNOSIS — Z90710 Acquired absence of both cervix and uterus: Secondary | ICD-10-CM | POA: Insufficient documentation

## 2012-06-18 DIAGNOSIS — Z3202 Encounter for pregnancy test, result negative: Secondary | ICD-10-CM | POA: Insufficient documentation

## 2012-06-18 LAB — CBC WITH DIFFERENTIAL/PLATELET
Basophils Relative: 0 % (ref 0–1)
Eosinophils Absolute: 0 10*3/uL (ref 0.0–0.7)
Eosinophils Relative: 0 % (ref 0–5)
HCT: 35 % — ABNORMAL LOW (ref 36.0–46.0)
Hemoglobin: 11.1 g/dL — ABNORMAL LOW (ref 12.0–15.0)
MCH: 27 pg (ref 26.0–34.0)
MCHC: 31.7 g/dL (ref 30.0–36.0)
MCV: 85.2 fL (ref 78.0–100.0)
Monocytes Absolute: 0.7 10*3/uL (ref 0.1–1.0)
Monocytes Relative: 8 % (ref 3–12)

## 2012-06-18 LAB — COMPREHENSIVE METABOLIC PANEL
ALT: 16 U/L (ref 0–35)
AST: 25 U/L (ref 0–37)
Albumin: 3.8 g/dL (ref 3.5–5.2)
Alkaline Phosphatase: 66 U/L (ref 39–117)
Calcium: 9.3 mg/dL (ref 8.4–10.5)
Potassium: 3.6 mEq/L (ref 3.5–5.1)
Sodium: 138 mEq/L (ref 135–145)
Total Protein: 8.5 g/dL — ABNORMAL HIGH (ref 6.0–8.3)

## 2012-06-18 LAB — URINALYSIS, ROUTINE W REFLEX MICROSCOPIC
Glucose, UA: NEGATIVE mg/dL
Ketones, ur: NEGATIVE mg/dL
Protein, ur: NEGATIVE mg/dL

## 2012-06-18 MED ORDER — KETOROLAC TROMETHAMINE 30 MG/ML IJ SOLN
30.0000 mg | Freq: Once | INTRAMUSCULAR | Status: AC
  Administered 2012-06-18: 30 mg via INTRAVENOUS
  Filled 2012-06-18: qty 1

## 2012-06-18 MED ORDER — IBUPROFEN 800 MG PO TABS: 800.0000 mg | ORAL_TABLET | Freq: Three times a day (TID) | ORAL | Status: DC

## 2012-06-18 MED ORDER — IOHEXOL 350 MG/ML SOLN
100.0000 mL | Freq: Once | INTRAVENOUS | Status: AC | PRN
  Administered 2012-06-18: 100 mL via INTRAVENOUS

## 2012-06-18 MED ORDER — FENTANYL CITRATE 0.05 MG/ML IJ SOLN
INTRAMUSCULAR | Status: AC
  Administered 2012-06-18: 50 ug via INTRAVENOUS
  Filled 2012-06-18: qty 2

## 2012-06-18 MED ORDER — ONDANSETRON HCL 4 MG/2ML IJ SOLN
4.0000 mg | Freq: Once | INTRAMUSCULAR | Status: AC
  Administered 2012-06-18: 4 mg via INTRAVENOUS
  Filled 2012-06-18: qty 2

## 2012-06-18 MED ORDER — HYDROCODONE-ACETAMINOPHEN 5-325 MG PO TABS: 2.0000 | ORAL_TABLET | ORAL | Status: DC | PRN

## 2012-06-18 MED ORDER — FENTANYL CITRATE 0.05 MG/ML IJ SOLN
50.0000 ug | Freq: Once | INTRAMUSCULAR | Status: AC
  Administered 2012-06-18: 50 ug via INTRAVENOUS

## 2012-06-18 NOTE — ED Notes (Signed)
Epigastric pain , RUQ pain , around to back,No NVD

## 2012-06-18 NOTE — ED Provider Notes (Signed)
History     This chart was scribed for Emma Octave, MD, MD by Smitty Pluck, ED Scribe. The patient was seen in room APA02/APA02 and the patient's care was started at 2:10 PM.   CSN: [id number]  Arrival date & time 06/18/12  1340       Chief Complaint  Patient presents with  . Abdominal Pain    (Consider location/radiation/quality/duration/timing/severity/associated sxs/prior treatment) The history is provided by the patient and medical records. No language interpreter was used.   Emma Huber is a 32 y.o. female with hx of GERD who presents to the Emergency Department complaining of constant, moderate substernal chest pain radiating to right side of back with sudden onset 1 day ago. Symptoms are worsening. She reports that the symptoms are worsening. She has taken Alka-seltzer and tylenol without relief. She states this does not feel like episodes of GERD. She reports having mild SOB. She denies pain or swelling in bilateral legs and diaphoresis. Pt denies taking birth control. She has had normal food and liquid intake. Pt denies rashes, abdominal pain, numbness, weakness, headache .Pt reports hx of hysterectomy.   History reviewed. No pertinent past medical history.  Past Surgical History  Procedure Laterality Date  . Tubal ligation  9 yrs ago    [facility]  . Hysteroscopy w/d&c  03/12/2011    Procedure: DILATATION AND CURETTAGE (D&C) /HYSTEROSCOPY;  Surgeon: Lazaro Arms, MD;  Location: [facility];  Service: Gynecology;  Laterality: N/A;  . Endometrial ablation  03/12/2011    Procedure: ENDOMETRIAL ABLATION;  Surgeon: Lazaro Arms, MD;  Location: [facility];  Service: Gynecology;  Laterality: N/A;  attempted ablation    unsuccessful  Mirena IUD inserted Lot# [id number]   EXP-06/26/2013  . Laparoscopic supracervical hysterectomy  12/10/2011    Procedure: LAPAROSCOPIC SUPRACERVICAL HYSTERECTOMY;  Surgeon: Lazaro Arms, MD;  Location: [facility];  Service: Gynecology;  Laterality: N/A;  . Iud  removal  12/10/2011    Procedure: INTRAUTERINE DEVICE (IUD) REMOVAL;  Surgeon: Lazaro Arms, MD;  Location: [facility];  Service: Gynecology;  Laterality: N/A;  . Partial hysterectomy    . Abdominal hysterectomy      Family History  Problem Relation Age of Onset  . Anesthesia problems Neg Hx   . Hypotension Neg Hx   . Malignant hyperthermia Neg Hx   . Pseudochol deficiency Neg Hx     History  Substance Use Topics  . Smoking status: Current Some Day Smoker -- 0.25 packs/day for 7 years    Types: Cigarettes  . Smokeless tobacco: Not on file  . Alcohol Use: Yes     Comment: occasionally    OB History   Grav Para Term Preterm Abortions TAB SAB Ect Mult Living                  Review of Systems 10 Systems reviewed and all are negative for acute change except as noted in the HPI.   Allergies  Review of patient's allergies indicates no known allergies.  Home Medications   Current Outpatient Rx  Name  Route  Sig  Dispense  Refill  . acetaminophen (TYLENOL) 500 MG tablet   Oral   Take 1,000-1,500 mg by mouth every 6 (six) hours as needed. Pain.         . cyclobenzaprine (FLEXERIL) 10 MG tablet      1/2 to one tab po TID   20 tablet   0   . ibuprofen (ADVIL,MOTRIN) 200 MG tablet  Oral   Take 800 mg by mouth every 6 (six) hours as needed. Pain.         Marland Kitchen loratadine (CLARITIN) 10 MG tablet   Oral   Take 10 mg by mouth daily.         . naphazoline (CLEAR EYES) 0.012 % ophthalmic solution   Both Eyes   Place 1 drop into both eyes 4 (four) times daily as needed. Eye Irritation         . ranitidine (ZANTAC) 150 MG tablet   Oral   Take 150 mg by mouth 2 (two) times daily.           BP 108/62  Pulse 79  Temp(Src) 98.5 F (36.9 C) (Oral)  Resp 26  Ht 5\' 7"  (1.702 m)  Wt 225 lb (102.059 kg)  BMI 35.23 kg/m2  SpO2 100%  LMP 11/26/2011  Physical Exam  Nursing note and vitals reviewed. Constitutional: She is oriented to person, place, and time. She  appears well-developed and well-nourished.  Uncomfortable   HENT:  Head: Normocephalic and atraumatic.  Eyes: Conjunctivae and EOM are normal.  Neck: Normal range of motion. Neck supple.  Cardiovascular: Normal rate, regular rhythm and normal heart sounds.   Pulmonary/Chest: Effort normal and breath sounds normal. No respiratory distress. She exhibits tenderness (right sideded that is reproducible).  Abdominal: There is tenderness (mild) in the right upper quadrant and epigastric area.  Musculoskeletal: Normal range of motion. She exhibits no edema.  Right paraspinal thoracic pain   Neurological: She is alert and oriented to person, place, and time. No cranial nerve deficit.  Skin: Skin is warm and dry. She is not diaphoretic. No pallor.  Psychiatric: She has a normal mood and affect. Her behavior is normal.    ED Course  Procedures (including critical care time) DIAGNOSTIC STUDIES: Oxygen Saturation is 100% on room air, normal by my interpretation.    COORDINATION OF CARE: 2:16 PM Discussed ED treatment with pt and pt agrees.  2:20 PM Ordered:  Medications  fentaNYL (SUBLIMAZE) injection 50 mcg (50 mcg Intravenous Given 06/18/12 1413)  ketorolac (TORADOL) 30 MG/ML injection 30 mg (30 mg Intravenous Given 06/18/12 1427)  ondansetron (ZOFRAN) injection 4 mg (4 mg Intravenous Given 06/18/12 1427)       Labs Reviewed  COMPREHENSIVE METABOLIC PANEL - Abnormal; Notable for the following:    Total Protein 8.5 (*)    Total Bilirubin 0.2 (*)    GFR calc non Af Amer 73 (*)    GFR calc Af Amer 85 (*)    All other components within normal limits  D-DIMER, QUANTITATIVE - Abnormal; Notable for the following:    D-Dimer, Quant 1.30 (*)    All other components within normal limits  CBC WITH DIFFERENTIAL - Abnormal; Notable for the following:    Hemoglobin 11.1 (*)    HCT 35.0 (*)    All other components within normal limits  TROPONIN I  LIPASE, BLOOD  URINALYSIS, ROUTINE W REFLEX  MICROSCOPIC  PREGNANCY, URINE   Dg Chest 2 View  06/18/2012  *RADIOLOGY REPORT*  Clinical Data: Chest pain.  CHEST - 2 VIEW  Comparison: 01/04/2012  Findings: There are low lung volumes.  Bibasilar opacities noted, likely atelectasis.  Heart size is accentuated by the low volumes, likely within normal limits.  No effusions.  No acute bony abnormality.  IMPRESSION: Low lung volumes with bibasilar opacities, likely atelectasis.   Original Report Authenticated By: Charlett Nose, M.D.    US Abdomen  Limited Ruq  06/18/2012  *RADIOLOGY REPORT*  Clinical Data:  Right upper quadrant pain.  LIMITED ABDOMINAL ULTRASOUND - RIGHT UPPER QUADRANT  Comparison:  None  Findings:  Gallbladder:  Ring down artifact noted off the anterior wall of the gallbladder compatible with adenomyomatosis.  No visible stones. Gallbladder wall slightly thickened at 3-4 mm.  Negative sonographic Murphy's.  Common bile duct:  Normal caliber, 2 mm.  Liver:  Several small echogenic areas within the liver.  The largest measures 13 mm in the right hepatic lobe.  This is most compatible with small hemangiomas.  Incidentally noted is trace right pleural effusion.  IMPRESSION: Small scattered hyperechoic areas within the liver, most compatible with hemangiomas.  These can be followed with repeat ultrasound in 6-12 months to assure stability.  Ring down artifact from the anterior wall the gallbladder compatible with adenomyomatosis.  No gallstones or evidence for acute cholecystitis.  Trace right effusion.                    Original Report Authenticated By: Charlett Nose, M.D.      No diagnosis found.    MDM  Right-sided chest pain radiating to the mid back for the past day it is constant. It is worse with inspiration and palpation. No shortness of breath, nausea, vomiting or diarrhea.  Pain worse with palpation. Atypical for ACS or PE. No rash. Mild RUQ and epigastric tenderness. No evidence of gallstones. Normal LFTs, normal lipase. No  white count. Pain has resolved after medications.  Suspect muscular skeletal chest wall pain. No evidence of PE. No evidence of cholecystitis. Pain is reproducible to palpation and worse with movement. EKG normal sinus rhythm. Troponin negative. D-dimer positive but CT PE negative.   Date: 06/18/2012  Rate: 68  Rhythm: normal sinus rhythm  QRS Axis: normal  Intervals: normal  ST/T Wave abnormalities: normal  Conduction Disutrbances:none  Narrative Interpretation:   Old EKG Reviewed: none available   I personally performed the services described in this documentation, which was scribed in my presence. The recorded information has been reviewed and is accurate.    Emma Octave, MD 06/18/12 2049

## 2012-06-18 NOTE — ED Notes (Signed)
Patient with no complaints at this time. Respirations even and unlabored. Skin warm/dry. Discharge instructions reviewed with patient at this time. Reviewed My Chart. Patient given opportunity to voice concerns/ask questions. IV removed per policy and band-aid applied to site. Patient discharged at this time and left Emergency Department with steady gait.

## 2012-12-22 ENCOUNTER — Telehealth: Payer: Self-pay | Admitting: Adult Health

## 2012-12-22 ENCOUNTER — Telehealth (HOSPITAL_COMMUNITY): Payer: Self-pay | Admitting: Dietician

## 2012-12-22 NOTE — Telephone Encounter (Signed)
Received message from Rodney Booze, referral at [facility] on 12/17/12 at 1014 for new pt referral.

## 2012-12-22 NOTE — Telephone Encounter (Signed)
Called at 1022. Appointment scheduled for 01/12/13 at 1400. Dx: obesity, prevention of further diabetes complications, on metformin, hyperinsulinemia.

## 2012-12-22 NOTE — Telephone Encounter (Signed)
Returned call on 12/20/12 at 1014 and left voicemail. Reattempted at 1511; unavailable.

## 2012-12-22 NOTE — Telephone Encounter (Signed)
Had Hysterectomy a year ago, has noticed some spotting that started 2 days ago, cramping, some headache, (the same symptoms as if she would start a period. Always has the cramping every month but this is the first time she has had the spotting. Pt states she is not having any urinary symptoms. Pt advised that she should be seen and was given an appointment for Thursday but could not come due to work. Pt was then given an appointment for the 8th to work with her schedule.

## 2012-12-23 ENCOUNTER — Ambulatory Visit: Payer: Self-pay | Admitting: Obstetrics & Gynecology

## 2013-01-03 ENCOUNTER — Ambulatory Visit (INDEPENDENT_AMBULATORY_CARE_PROVIDER_SITE_OTHER): Payer: Medicaid Other | Admitting: Obstetrics & Gynecology

## 2013-01-03 ENCOUNTER — Encounter: Payer: Self-pay | Admitting: Obstetrics & Gynecology

## 2013-01-03 VITALS — BP 110/80 | Ht 66.0 in | Wt 231.0 lb

## 2013-01-03 DIAGNOSIS — N939 Abnormal uterine and vaginal bleeding, unspecified: Secondary | ICD-10-CM

## 2013-01-03 DIAGNOSIS — N898 Other specified noninflammatory disorders of vagina: Secondary | ICD-10-CM

## 2013-01-03 NOTE — Progress Notes (Signed)
Patient ID: Emma Huber, female   DOB: [birth year], 33 y.o.   MRN: [id number] The patient had a laparoscopic supracervical hysterectomy a year ago Recently she just started having a low bit of vaginal spotting No complaint of discharge Not associated with intercourse  Exam Cervix is normal with no polyp or other abnormality Vagina is without discharge  Impression Vaginal spotting status post a laparoscopic supracervical hysterectomy due to hormonal he sensitive endocervical glands No clinical importance  Plan No further followup or tears needed patient no she can expect occasional spotting from those hormonally sensitive endocervical glands

## 2013-01-12 ENCOUNTER — Encounter (HOSPITAL_COMMUNITY): Payer: Self-pay | Admitting: Dietician

## 2013-01-12 DIAGNOSIS — E669 Obesity, unspecified: Secondary | ICD-10-CM | POA: Insufficient documentation

## 2013-01-12 DIAGNOSIS — R7303 Prediabetes: Secondary | ICD-10-CM | POA: Insufficient documentation

## 2013-01-12 DIAGNOSIS — K219 Gastro-esophageal reflux disease without esophagitis: Secondary | ICD-10-CM | POA: Insufficient documentation

## 2013-01-12 NOTE — Progress Notes (Signed)
Outpatient Initial Nutrition Assessment  Date:01/12/2013   Appt Start Time: 1407  Referring Physician: [facility] Reason for Visit: obesity, prediabetes (Insulin level: 46)  Nutrition Assessment:  Height: 5\' 7"  (170.2 cm)   Weight: 226 lb (102.513 kg)   IBW: 135# %IBW: 167% UBW: 226# %UBW: 100% Body mass index is 35.39 kg/(m^2).  Meets criteria for obesity, class II. Goal Weight: 203# (10% loss of current weight) Weight hx: Pt reports wt of 226# for the past few years. Her highest weight was 235# a few years ago. Her lowest weight was 190# in 2008.   Estimated nutritional needs:  Kcals/ day: 1700-1800 Protein (grams)/day: 82-105 Fluid (L)/ day: 1.7-1.8  PMH:  Past Medical History  Diagnosis Date  . GERD (gastroesophageal reflux disease)   . Obesity   . Prediabetes     Medications:  Current Outpatient Rx  Name  Route  Sig  Dispense  Refill  . acetaminophen (TYLENOL) 500 MG tablet   Oral   Take 1,000 mg by mouth daily as needed for pain. Pain.         . fluticasone (FLONASE) 50 MCG/ACT nasal spray   Nasal   Place 2 sprays into the nose daily as needed for rhinitis or allergies.         . Lansoprazole (PREVACID PO)   Oral   Take by mouth.         . Lorcaserin HCl (BELVIQ) 10 MG TABS   Oral   Take 10 mg by mouth 2 (two) times daily.         . metFORMIN (GLUCOPHAGE-XR) 500 MG 24 hr tablet   Oral   Take 500 mg by mouth at bedtime.         Marland Kitchen HYDROcodone-acetaminophen (NORCO/VICODIN) 5-325 MG per tablet   Oral   Take 2 tablets by mouth every 4 (four) hours as needed for pain.   10 tablet   0   . ibuprofen (ADVIL,MOTRIN) 800 MG tablet   Oral   Take 1 tablet (800 mg total) by mouth 3 (three) times daily.   21 tablet   0   . loratadine (CLARITIN) 10 MG tablet   Oral   Take 10 mg by mouth daily as needed for allergies.          . naphazoline (CLEAR EYES) 0.012 % ophthalmic solution   Both Eyes   Place 1 drop into both eyes 4 (four) times daily as needed.  Eye Irritation         . ranitidine (ZANTAC) 150 MG tablet   Oral   Take 150 mg by mouth daily as needed.            Labs: CMP     Component Value Date/Time   NA 138 06/18/2012 1415   K 3.6 06/18/2012 1415   CL 101 06/18/2012 1415   CO2 26 06/18/2012 1415   GLUCOSE 88 06/18/2012 1415   BUN 12 06/18/2012 1415   CREATININE 1.00 06/18/2012 1415   CALCIUM 9.3 06/18/2012 1415   PROT 8.5* 06/18/2012 1415   ALBUMIN 3.8 06/18/2012 1415   AST 25 06/18/2012 1415   ALT 16 06/18/2012 1415   ALKPHOS 66 06/18/2012 1415   BILITOT 0.2* 06/18/2012 1415   GFRNONAA 73* 06/18/2012 1415   GFRAA 85* 06/18/2012 1415    Lipid Panel  No results found for this basename: chol, trig, hdl, cholhdl, vldl, ldlcalc     No results found for this basename: HGBA1C   Lab Results  Component Value Date   CREATININE 1.00 06/18/2012     Lifestyle/ social habits: Ms. Hanna is a very pleasant lady who resides in [place] with her 4 children (boys- ages 85, 34, and 70, girl- age 25). She works part time at a [employer] in [place]. She reports her stress level as a 2-3/10; she reports that she tries hard not to her stressed up and generally has a positive, upbeat attitude. She reports that she walks 30-45 minutes around her community (approximately 1 mile) with her kids 3 times per week.  Nutrition hx/habits: Ms. Bialas reports that she desires weight loss. She reports she has tried many things over the past few years, mainly diet pills and an Atkins type diet. She reports having the most success on the Atkins diet, but it was difficult for her to maintain long term, as she loves bread and pasta. She is currently on Belviq and she reports that she is tolerating it well, however, "I pay for it if I eat something I shouldn't".  She is also on Metformin, with no complaints.  She has been choosing healthier options, such as eating more vegetables and cutting back on portions. She reports that she has gotten her family  involved in positive lifestyle changesand all have slimmed down, other than her. However, she can tell her clothing is looser. She reports she does not want to deprive her children of foods that they love, such as mac and cheese.  She goes out to eat rarely (1-2 times per month) where she orders a dollar menu fast food item or pizza.  Diet recall: Breakfast: Atkins shake, coffee; Lunch: water, ravioli cup OR [place] and cheese sandwich on honey wheat bread; Dinner: green beans or corn or salad, rice or pasta, baked meat, water.   Nutrition Diagnosis: Excessive carbohydrate intake r/t diet recall high in refined sugars AEB high insulin level (46).  Nutrition Intervention: Nutrition rx: 1400 kcal NAS, no sugar added diet; 3 meals per day (4-5 hours apart); limit snacks; low calorie beverages only; 2.5 hours physical activity per week  Education/Counseling Provided: Educated pt on principles of weight management. Discussed principles of energy expenditure and how changes in diet and physical activity affect weight status.  Also reviewed lab works and diet recall and discussed how labs works, along with poor lifestyle habits can increase the risk of developing co-morbidities, including diabetes. Discussed nutritional content of commonly eaten foods and suggested healthier alternatives. Educated pt on plate method and a general, healthful diet that includes low fat dairy, lean meats, whole fruits and vegetables, and whole grains most often. Discussed importance of a healthy diet along with regular physical activity (at least 30 minutes 5 times per week) to achieve weight loss goals. Encouraged slow, moderate weight loss (0.5-2# weight loss per week) and adopting healthy lifestyle changes vs. obtaining a certain body type or weight. Focused on a healthy lifestyle to prolong the development of co morbidities. Encouraged weighing self weekly at a consistent day and time of choice. Showed pt functionality of  MyFitnessPal and encouraged using a food diary to better track caloric intake. Used TeachBack to assess understanding.   Understanding, Motivation, Ability to Follow Recommendations: Expect fair to good compliance.  Monitoring and Evaluation: Goals: 1) 0.5-2# weight loss per week; 2) 2.5 hours physical activity per week  Recommendations: 1) For weight loss: 1300-1400 kcals daily; 2) Involve family in daily physical activity; 3) Find an enjoyable exercise; 4) Keep food diary  F/U: 4-6 weeks.  Scheduled for 02/23/13 at 0900.  Natalyah Cummiskey A. Mayford Knife, RD, LDN 01/12/2013  Appt EndTime: 1520

## 2013-01-21 ENCOUNTER — Telehealth (HOSPITAL_COMMUNITY): Payer: Self-pay | Admitting: Dietician

## 2013-01-21 NOTE — Telephone Encounter (Signed)
Received voicemail from pt left on 01/19/13 at 0855. Pt wants to know when her next appointment is.

## 2013-01-21 NOTE — Telephone Encounter (Signed)
Called back using number [phone] at [phone] per pt request. Pt unavailable. Could not leave message.

## 2013-01-21 NOTE — Telephone Encounter (Signed)
Called back at 1119. Pt reports she remembered her appointment. Confirmed 10/29 at 1400 appointment.

## 2013-02-03 ENCOUNTER — Other Ambulatory Visit: Payer: Medicaid Other | Admitting: Obstetrics & Gynecology

## 2013-02-09 ENCOUNTER — Telehealth (HOSPITAL_COMMUNITY): Payer: Self-pay | Admitting: Dietician

## 2013-02-09 NOTE — Telephone Encounter (Signed)
Called pt at 1617. Appointment rescheduled for 03/01/13 at 0900 due to provider conflict.

## 2013-02-23 ENCOUNTER — Ambulatory Visit (INDEPENDENT_AMBULATORY_CARE_PROVIDER_SITE_OTHER): Payer: Medicaid Other | Admitting: Obstetrics & Gynecology

## 2013-02-23 ENCOUNTER — Other Ambulatory Visit (HOSPITAL_COMMUNITY)
Admission: RE | Admit: 2013-02-23 | Discharge: 2013-02-23 | Disposition: A | Discharge status: Home / Self Care | Payer: Medicaid Other | Source: Ambulatory Visit | Attending: Obstetrics & Gynecology | Admitting: Obstetrics & Gynecology

## 2013-02-23 ENCOUNTER — Encounter: Payer: Self-pay | Admitting: Obstetrics & Gynecology

## 2013-02-23 VITALS — BP 108/72 | Ht 66.0 in | Wt 232.0 lb

## 2013-02-23 DIAGNOSIS — I499 Cardiac arrhythmia, unspecified: Secondary | ICD-10-CM

## 2013-02-23 DIAGNOSIS — Z0000 Encounter for general adult medical examination without abnormal findings: Secondary | ICD-10-CM

## 2013-02-23 DIAGNOSIS — Z1151 Encounter for screening for human papillomavirus (HPV): Secondary | ICD-10-CM | POA: Insufficient documentation

## 2013-02-23 DIAGNOSIS — Z01419 Encounter for gynecological examination (general) (routine) without abnormal findings: Secondary | ICD-10-CM | POA: Insufficient documentation

## 2013-02-23 NOTE — Addendum Note (Signed)
Addended by: Malachy Mood S on: 02/23/2013 04:00 PM   Modules accepted: Orders

## 2013-02-23 NOTE — Progress Notes (Signed)
Patient ID: Emma Huber, female   DOB: 10-Jun-1980, 32 y.o.   MRN: [id number] Subjective:     Emma Huber is a 32 y.o. female here for a routine exam.  Patient's last menstrual period was 11/26/2011. No obstetric history on file. Current complaints: none.    Gynecologic History Patient's last menstrual period was 11/26/2011. Contraception: status post hysterectomy Last Pap: 2013. Results were: normal Last mammogram: na. Results were: na  Past Medical History  Diagnosis Date  . GERD (gastroesophageal reflux disease)   . Obesity   . Prediabetes     Past Surgical History  Procedure Laterality Date  . Tubal ligation  9 yrs ago    [facility]  . Hysteroscopy w/d&c  03/12/2011    Procedure: DILATATION AND CURETTAGE (D&C) /HYSTEROSCOPY;  Surgeon: Lazaro Arms, MD;  Location: [facility];  Service: Gynecology;  Laterality: N/A;  . Endometrial ablation  03/12/2011    Procedure: ENDOMETRIAL ABLATION;  Surgeon: Lazaro Arms, MD;  Location: [facility];  Service: Gynecology;  Laterality: N/A;  attempted ablation    unsuccessful  Mirena IUD inserted Lot# [id number]   EXP-06/26/2013  . Laparoscopic supracervical hysterectomy  12/10/2011    Procedure: LAPAROSCOPIC SUPRACERVICAL HYSTERECTOMY;  Surgeon: Lazaro Arms, MD;  Location: [facility];  Service: Gynecology;  Laterality: N/A;  . Iud removal  12/10/2011    Procedure: INTRAUTERINE DEVICE (IUD) REMOVAL;  Surgeon: Lazaro Arms, MD;  Location: [facility];  Service: Gynecology;  Laterality: N/A;  . Partial hysterectomy    . Abdominal hysterectomy      OB History   Grav Para Term Preterm Abortions TAB SAB Ect Mult Living                  History   Social History  . Marital Status: Single    Spouse Name: N/A    Number of Children: N/A  . Years of Education: N/A   Social History Main Topics  . Smoking status: Current Every Day Smoker -- 0.25 packs/day for 7 years    Types: Cigarettes  . Smokeless tobacco: Never Used  . Alcohol Use: Yes     Comment:  occasionally  . Drug Use: No     Comment: 1 year since last joint  . Sexual Activity: Yes    Birth Control/ Protection: Surgical   Other Topics Concern  . None   Social History Narrative  . None    Family History  Problem Relation Age of Onset  . Anesthesia problems Neg Hx   . Hypotension Neg Hx   . Malignant hyperthermia Neg Hx   . Pseudochol deficiency Neg Hx   . Arthritis Mother   . Alcohol abuse Father   . Cirrhosis Father      Review of Systems  Review of Systems  Constitutional: Negative for fever, chills, weight loss, malaise/fatigue and diaphoresis.  HENT: Negative for hearing loss, ear pain, nosebleeds, congestion, sore throat, neck pain, tinnitus and ear discharge.   Eyes: Negative for blurred vision, double vision, photophobia, pain, discharge and redness.  Respiratory: Negative for cough, hemoptysis, sputum production, shortness of breath, wheezing and stridor.   Cardiovascular: Negative for chest pain, palpitations, orthopnea, claudication, leg swelling and PND.  Gastrointestinal: negative for abdominal pain. Negative for heartburn, nausea, vomiting, diarrhea, constipation, blood in stool and melena.  Genitourinary: Negative for dysuria, urgency, frequency, hematuria and flank pain.  Musculoskeletal: Negative for myalgias, back pain, joint pain and falls.  Skin: Negative for itching and rash.  Neurological: Negative for dizziness, tingling, tremors, sensory change, speech change, focal weakness, seizures, loss of consciousness, weakness and headaches.  Endo/Heme/Allergies: Negative for environmental allergies and polydipsia. Does not bruise/bleed easily.  Psychiatric/Behavioral: Negative for depression, suicidal ideas, hallucinations, memory loss and substance abuse. The patient is not nervous/anxious and does not have insomnia.        Objective:    Physical Exam  Vitals reviewed. Constitutional: She is oriented to person, place, and time. She appears  well-developed and well-nourished.  HENT:  Head: Normocephalic and atraumatic.        Right Ear: External ear normal.  Left Ear: External ear normal.  Nose: Nose normal.  Mouth/Throat: Oropharynx is clear and moist.  Eyes: Conjunctivae and EOM are normal. Pupils are equal, round, and reactive to light. Right eye exhibits no discharge. Left eye exhibits no discharge. No scleral icterus.  Neck: Normal range of motion. Neck supple. No tracheal deviation present. No thyromegaly present.  Cardiovascular: irregular rhythm with compensatory pause Respiratory: Effort normal and breath sounds normal. No respiratory distress. She has no wheezes. She has no rales. She exhibits no tenderness.  GI: Soft. Bowel sounds are normal. She exhibits no distension and no mass. There is no tenderness. There is no rebound and no guarding.  Genitourinary:  Breasts no masses skin changes or nipple changes bilaterally      Vulva is normal without lesions Vagina is pink moist without discharge Cervix normal in appearance and pap is done Uterus is absent Adnexa is negative with normal sized ovaries  Musculoskeletal: Normal range of motion. She exhibits no edema and no tenderness.  Neurological: She is alert and oriented to person, place, and time. She has normal reflexes. She displays normal reflexes. No cranial nerve deficit. She exhibits normal muscle tone. Coordination normal.  Skin: Skin is warm and dry. No rash noted. No erythema. No pallor.  Psychiatric: She has a normal mood and affect. Her behavior is normal. Judgment and thought content normal.       Assessment:    Healthy female exam.    Plan:    Follow up in: 1 year.   Needs Holter 48 hour for evaluation

## 2013-02-28 ENCOUNTER — Ambulatory Visit (HOSPITAL_COMMUNITY)
Admission: RE | Admit: 2013-02-28 | Discharge: 2013-02-28 | Disposition: A | Discharge status: Home / Self Care | Payer: Medicaid Other | Source: Ambulatory Visit | Attending: Obstetrics & Gynecology | Admitting: Obstetrics & Gynecology

## 2013-02-28 DIAGNOSIS — R002 Palpitations: Secondary | ICD-10-CM

## 2013-02-28 DIAGNOSIS — Z01419 Encounter for gynecological examination (general) (routine) without abnormal findings: Secondary | ICD-10-CM

## 2013-02-28 NOTE — Progress Notes (Signed)
48 hour Holter Monitor in progress. Ends @ 10:06 on Wednesday 03-02-13.

## 2013-03-01 ENCOUNTER — Telehealth (HOSPITAL_COMMUNITY): Payer: Self-pay | Admitting: Dietician

## 2013-03-01 NOTE — Telephone Encounter (Signed)
Received voicemail from pt at 0815. She reports she need to reschedule her appointment today at 0900.

## 2013-03-01 NOTE — Telephone Encounter (Signed)
Called pt at 1544. Appointment rescheduled for 03/14/13 at 1100.

## 2013-03-09 SURGERY — Surgical Case
Anesthesia: *Unknown

## 2013-03-10 ENCOUNTER — Telehealth: Payer: Self-pay | Admitting: Obstetrics & Gynecology

## 2013-03-10 NOTE — Telephone Encounter (Signed)
It has not been read yet, but the physician now has it and they are going to let me know when done and we will let Natia know as well

## 2013-03-10 NOTE — Telephone Encounter (Signed)
Informed pt holter monitoring results pending, our office will contact pt with results per Dr. Despina Hidden.

## 2013-03-14 ENCOUNTER — Encounter (HOSPITAL_COMMUNITY): Payer: Self-pay | Admitting: Dietician

## 2013-03-14 NOTE — Progress Notes (Signed)
Follow-Up Outpatient Nutrition Note Date: 03/14/2013  Appt Start Time: 1109  Nutrition Assessment:  Current weight: Weight: 234 lb (106.142 kg)  BMI: Body mass index is 36.64 kg/(m^2).  Weight changes: +8# (3.5%) wt loss x 6 weeks  Unfortauntely, Emma Huber' progress has deteriorated. She reports "I just need something to give me more energy". She reports that she is tired all time. She reveals that she is stressed due to her busy schedule of work and balancing her children's commitments. Additionally, she has started working on the weekends, when she would used to exercise and get her chores done. On top of all this, she revealed that her grandmother has recently moved in with her, which is an added source of stress.  She reports that she has stopped exercising due to lack of energy and time. Her diet remains the same, although, I suspect some underreporting (pt reports she ate red velvet cake before today's appointment). We discussed ways to incorporate physical activity, including walking at the mall or working out to exercise videos at home. Pt was very receptive to mall walking, as [place] home. She reports "I can do that; I can make a playlist and listen to it when I walk".   Labs: CMP     Component Value Date/Time   NA 138 06/18/2012 1415   K 3.6 06/18/2012 1415   CL 101 06/18/2012 1415   CO2 26 06/18/2012 1415   GLUCOSE 88 06/18/2012 1415   BUN 12 06/18/2012 1415   CREATININE 1.00 06/18/2012 1415   CALCIUM 9.3 06/18/2012 1415   PROT 8.5* 06/18/2012 1415   ALBUMIN 3.8 06/18/2012 1415   AST 25 06/18/2012 1415   ALT 16 06/18/2012 1415   ALKPHOS 66 06/18/2012 1415   BILITOT 0.2* 06/18/2012 1415   GFRNONAA 73* 06/18/2012 1415   GFRAA 85* 06/18/2012 1415    Lipid Panel  No results found for this basename: chol, trig, hdl, cholhdl, vldl, ldlcalc     No results found for this basename: HGBA1C   Lab Results  Component Value Date   CREATININE 1.00 06/18/2012     Diet  recall: Breakfast: Atkins shakes; Lunch: lunch meat and cheese; Dinner: chicken, fries, vegetable OR salad. Beverages are only water.   Nutrition Diagnosis: Excessive carbohydrate intake continues  Nutrition Intervention: Nutrition rx: 1400 kcal NAS, no sugar added diet; 3 meals per day (4-5 hours apart); limit snacks; low calorie beverages only; 2.5 hours physical activity per week  Education/ counseling provided: Reviewed diet recall and exercise regimen. Discussed importance of regular physical activity along with a healthy diet for weight loss. Provided emotional supports. Discussed ways to incorporate physical activity into her busy lifestyle. Teachback method used.   Understanding/Motivation/ Ability to follow recommendations: Expect fair compliance.  Monitoring and Evaluation: Previous Goals: 1) 0.5-2# wt loss per week- goal not met; 2) 2.5 hours physical activity per week- goal not met  Goals for next visit: 1) 0.5-2# weight loss per week; 2) 2.5 hours physical activity per week  Recommendations: 1) Break up physical activity into smaller sessions; 2) Continue to eat together as a family; 3) Consider eating a small snack (fruit, vegetables, string cheese) if long wait time during meals due to kids' schedules; 4) Consider scheduling a time to walk  F/U: 6-8 weeks. Scheduled for 05/02/13 at 1100.  Sharilynn Cassity A. Mayford Knife, RD, LDN Date:03/14/2013 Appt EndTime: 1140

## 2013-05-02 ENCOUNTER — Telehealth (HOSPITAL_COMMUNITY): Payer: Self-pay | Admitting: Dietician

## 2013-05-02 NOTE — Telephone Encounter (Signed)
Pt was a no-show for appointment scheduled for 05/02/2013 at 1100. Sent letter to pt home notifying pt of no-show and requesting rescheduling appointment.

## 2013-09-21 ENCOUNTER — Encounter (HOSPITAL_COMMUNITY): Payer: Self-pay | Admitting: Emergency Medicine

## 2013-09-21 ENCOUNTER — Emergency Department (HOSPITAL_COMMUNITY)
Admission: EM | Admit: 2013-09-21 | Discharge: 2013-09-21 | Disposition: A | Discharge status: Home / Self Care | Payer: Medicaid Other | Attending: Emergency Medicine | Admitting: Emergency Medicine

## 2013-09-21 DIAGNOSIS — L089 Local infection of the skin and subcutaneous tissue, unspecified: Secondary | ICD-10-CM | POA: Insufficient documentation

## 2013-09-21 DIAGNOSIS — Y929 Unspecified place or not applicable: Secondary | ICD-10-CM | POA: Insufficient documentation

## 2013-09-21 DIAGNOSIS — E669 Obesity, unspecified: Secondary | ICD-10-CM | POA: Insufficient documentation

## 2013-09-21 DIAGNOSIS — Y939 Activity, unspecified: Secondary | ICD-10-CM | POA: Insufficient documentation

## 2013-09-21 DIAGNOSIS — K219 Gastro-esophageal reflux disease without esophagitis: Secondary | ICD-10-CM | POA: Insufficient documentation

## 2013-09-21 DIAGNOSIS — F17200 Nicotine dependence, unspecified, uncomplicated: Secondary | ICD-10-CM | POA: Insufficient documentation

## 2013-09-21 DIAGNOSIS — Z791 Long term (current) use of non-steroidal anti-inflammatories (NSAID): Secondary | ICD-10-CM | POA: Insufficient documentation

## 2013-09-21 DIAGNOSIS — Z79899 Other long term (current) drug therapy: Secondary | ICD-10-CM | POA: Insufficient documentation

## 2013-09-21 DIAGNOSIS — IMO0002 Reserved for concepts with insufficient information to code with codable children: Secondary | ICD-10-CM | POA: Insufficient documentation

## 2013-09-21 DIAGNOSIS — S80869A Insect bite (nonvenomous), unspecified lower leg, initial encounter: Principal | ICD-10-CM

## 2013-09-21 DIAGNOSIS — W57XXXA Bitten or stung by nonvenomous insect and other nonvenomous arthropods, initial encounter: Secondary | ICD-10-CM

## 2013-09-21 MED ORDER — DOXYCYCLINE HYCLATE 100 MG PO CAPS: 100.0000 mg | ORAL_CAPSULE | Freq: Two times a day (BID) | ORAL | Status: DC

## 2013-09-21 MED ORDER — BACITRACIN-NEOMYCIN-POLYMYXIN 400-5-5000 EX OINT
TOPICAL_OINTMENT | Freq: Once | CUTANEOUS | Status: AC
  Administered 2013-09-21: 20:00:00 via TOPICAL
  Filled 2013-09-21: qty 1

## 2013-09-21 NOTE — ED Notes (Signed)
Patient given discharge instruction, verbalized understand. Patient ambulatory out of the department.  

## 2013-09-21 NOTE — ED Notes (Signed)
Patient states she was bitten by what she thought was a tick on Monday; states pulled something off; tonight noticed it was bleeding.

## 2013-09-21 NOTE — ED Provider Notes (Signed)
CSN: [id number]     Arrival date & time 09/21/13  1923 History   First MD Initiated Contact with Patient 09/21/13 1944     Chief Complaint  Patient presents with  . Insect Bite     (Consider location/radiation/quality/duration/timing/severity/associated sxs/prior Treatment) The history is provided by the patient.   Emma Huber is a 33 y.o. female who presents to the ED with an area on her left thigh where she pulled what she thought was a tick off 3 days ago. Tonight she noted the area to be draining and red around the bite. The area is tender. She denies fever, chills, headache, rash, nausea, vomiting or any other problems.   Past Medical History  Diagnosis Date  . GERD (gastroesophageal reflux disease)   . Obesity   . Prediabetes    Past Surgical History  Procedure Laterality Date  . Tubal ligation  9 yrs ago    [facility]  . Hysteroscopy w/d&c  03/12/2011    Procedure: DILATATION AND CURETTAGE (D&C) /HYSTEROSCOPY;  Surgeon: Florian Buff, MD;  Location: [facility];  Service: Gynecology;  Laterality: N/A;  . Endometrial ablation  03/12/2011    Procedure: ENDOMETRIAL ABLATION;  Surgeon: Florian Buff, MD;  Location: [facility];  Service: Gynecology;  Laterality: N/A;  attempted ablation    unsuccessful  Mirena IUD inserted Lot# [id number]   EXP-06/26/2013  . Laparoscopic supracervical hysterectomy  12/10/2011    Procedure: LAPAROSCOPIC SUPRACERVICAL HYSTERECTOMY;  Surgeon: Florian Buff, MD;  Location: [facility];  Service: Gynecology;  Laterality: N/A;  . Iud removal  12/10/2011    Procedure: INTRAUTERINE DEVICE (IUD) REMOVAL;  Surgeon: Florian Buff, MD;  Location: [facility];  Service: Gynecology;  Laterality: N/A;  . Partial hysterectomy    . Abdominal hysterectomy     Family History  Problem Relation Age of Onset  . Anesthesia problems Neg Hx   . Hypotension Neg Hx   . Malignant hyperthermia Neg Hx   . Pseudochol deficiency Neg Hx   . Arthritis Mother   . Alcohol abuse Father   . Cirrhosis  Father    History  Substance Use Topics  . Smoking status: Current Some Day Smoker -- 0.25 packs/day for 7 years    Types: Cigarettes  . Smokeless tobacco: Never Used  . Alcohol Use: Yes     Comment: occasionally   OB History   Grav Para Term Preterm Abortions TAB SAB Ect Mult Living                 Review of Systems Negative except as stated in HPI   Allergies  Review of patient's allergies indicates no known allergies.  Home Medications   Prior to Admission medications   Medication Sig Start Date End Date Taking? Authorizing Provider  acetaminophen (TYLENOL) 500 MG tablet Take 1,000 mg by mouth daily as needed for pain. Pain.    Historical Provider, MD  fluticasone (FLONASE) 50 MCG/ACT nasal spray Place 2 sprays into the nose daily as needed for rhinitis or allergies.    Historical Provider, MD  HYDROcodone-acetaminophen (NORCO/VICODIN) 5-325 MG per tablet Take 2 tablets by mouth every 4 (four) hours as needed for pain. 06/18/12   Ezequiel Essex, MD  ibuprofen (ADVIL,MOTRIN) 800 MG tablet Take 1 tablet (800 mg total) by mouth 3 (three) times daily. 06/18/12   Ezequiel Essex, MD  Lansoprazole (PREVACID PO) Take by mouth.    Historical Provider, MD  loratadine (CLARITIN) 10 MG tablet Take 10 mg by  mouth daily as needed for allergies.     Historical Provider, MD  Lorcaserin HCl (BELVIQ) 10 MG TABS Take 10 mg by mouth 2 (two) times daily. 11/09/12   Historical Provider, MD  metFORMIN (GLUCOPHAGE-XR) 500 MG 24 hr tablet Take 500 mg by mouth at bedtime.    Historical Provider, MD  naphazoline (CLEAR EYES) 0.012 % ophthalmic solution Place 1 drop into both eyes 4 (four) times daily as needed. Eye Irritation    Historical Provider, MD  ranitidine (ZANTAC) 150 MG tablet Take 150 mg by mouth daily as needed.     Historical Provider, MD   BP 109/61  Pulse 87  Temp(Src) 98.6 F (37 C) (Oral)  Resp 20  Ht 5\' 7"  (1.702 m)  Wt 225 lb (102.059 kg)  BMI 35.23 kg/m2  SpO2 100%  LMP  11/26/2011 Physical Exam  Nursing note and vitals reviewed. Constitutional: She is oriented to person, place, and time. She appears well-developed and well-nourished.  Eyes: EOM are normal.  Neck: Neck supple.  Pulmonary/Chest: Effort normal.  Abdominal: Soft. There is no tenderness.  Musculoskeletal: Normal range of motion.  Left lateral thigh with red raised area and small drainage with surrounding erythema.   Neurological: She is alert and oriented to person, place, and time. No cranial nerve deficit.  Skin: Skin is warm and dry.  Psychiatric: She has a normal mood and affect. Her behavior is normal.    ED Course  Procedures (including critical care time) Labs Review  MDM  33 y.o. female with redness and drainage to the left lateral thigh s/p tick bite. Stable for discharge without signs/symptoms of RMSF. Wound cleaned, bacitracin ointment, dressing. PO antibiotics and follow up as needed. Discussed with the patient and all questioned fully answered.    Medication List    TAKE these medications       doxycycline 100 MG capsule  Commonly known as:  VIBRAMYCIN  Take 1 capsule (100 mg total) by mouth 2 (two) times daily.      ASK your doctor about these medications       acetaminophen 500 MG tablet  Commonly known as:  TYLENOL  Take 1,000 mg by mouth daily as needed for pain. Pain.     fluticasone 50 MCG/ACT nasal spray  Commonly known as:  FLONASE  Place 2 sprays into the nose daily as needed for rhinitis or allergies.     loratadine 10 MG tablet  Commonly known as:  CLARITIN  Take 10 mg by mouth daily as needed for allergies.     metFORMIN 500 MG 24 hr tablet  Commonly known as:  GLUCOPHAGE-XR  Take 500 mg by mouth at bedtime.     naphazoline 0.012 % ophthalmic solution  Commonly known as:  CLEAR EYES  Place 1 drop into both eyes 4 (four) times daily as needed. Eye Irritation     ranitidine 150 MG tablet  Commonly known as:  ZANTAC  Take 150 mg by mouth daily  as needed.          [facility] Bunnie Pion, NP 09/21/13 2000

## 2013-09-22 NOTE — ED Provider Notes (Signed)
Medical screening examination/treatment/procedure(s) were performed by non-physician practitioner and as supervising physician I was immediately available for consultation/collaboration.   EKG Interpretation None       Jasper Riling. Alvino Chapel, MD 09/22/13 [phone]

## 2013-09-30 ENCOUNTER — Emergency Department (HOSPITAL_COMMUNITY): Payer: Medicaid Other

## 2013-09-30 ENCOUNTER — Encounter (HOSPITAL_COMMUNITY): Payer: Self-pay | Admitting: Emergency Medicine

## 2013-09-30 ENCOUNTER — Emergency Department (HOSPITAL_COMMUNITY)
Admission: EM | Admit: 2013-09-30 | Discharge: 2013-09-30 | Disposition: A | Discharge status: Home / Self Care | Payer: Medicaid Other | Attending: Emergency Medicine | Admitting: Emergency Medicine

## 2013-09-30 DIAGNOSIS — F17200 Nicotine dependence, unspecified, uncomplicated: Secondary | ICD-10-CM | POA: Insufficient documentation

## 2013-09-30 DIAGNOSIS — E669 Obesity, unspecified: Secondary | ICD-10-CM | POA: Insufficient documentation

## 2013-09-30 DIAGNOSIS — Z79899 Other long term (current) drug therapy: Secondary | ICD-10-CM | POA: Insufficient documentation

## 2013-09-30 DIAGNOSIS — M722 Plantar fascial fibromatosis: Secondary | ICD-10-CM | POA: Insufficient documentation

## 2013-09-30 DIAGNOSIS — K219 Gastro-esophageal reflux disease without esophagitis: Secondary | ICD-10-CM | POA: Insufficient documentation

## 2013-09-30 MED ORDER — NAPROXEN 500 MG PO TABS: 500.0000 mg | ORAL_TABLET | Freq: Two times a day (BID) | ORAL | Status: DC

## 2013-09-30 NOTE — ED Notes (Signed)
Left foot pain "for a while"   Denies any injury.

## 2013-09-30 NOTE — ED Provider Notes (Signed)
CSN: [id number]     Arrival date & time 09/30/13  0803 History   First MD Initiated Contact with Patient 09/30/13 0825     Chief Complaint  Patient presents with  . Foot Pain     (Consider location/radiation/quality/duration/timing/severity/associated sxs/prior Treatment) Patient is a 33 y.o. female presenting with lower extremity pain. The history is provided by the patient.  Foot Pain This is a new problem. Episode onset: 2 weeks. The problem occurs constantly. The problem has been unchanged. Associated symptoms include arthralgias. Pertinent negatives include no chills, fever, joint swelling, neck pain, numbness, rash, vomiting or weakness. The symptoms are aggravated by standing and walking. She has tried nothing for the symptoms. The treatment provided no relief.   Patient reports pain to her left distal foot for 2 weeks. She states pain began after wearing a new pair of shoes to work. She describes the pain as a "aching". She is tried to massage her feet but has not tried any other therapies or medications. She denies recent injury, redness, swelling, or pain proximal to the ankle.  Past Medical History  Diagnosis Date  . GERD (gastroesophageal reflux disease)   . Obesity   . Prediabetes    Past Surgical History  Procedure Laterality Date  . Tubal ligation  9 yrs ago    [facility]  . Hysteroscopy w/d&c  03/12/2011    Procedure: DILATATION AND CURETTAGE (D&C) /HYSTEROSCOPY;  Surgeon: Florian Buff, MD;  Location: [facility];  Service: Gynecology;  Laterality: N/A;  . Endometrial ablation  03/12/2011    Procedure: ENDOMETRIAL ABLATION;  Surgeon: Florian Buff, MD;  Location: [facility];  Service: Gynecology;  Laterality: N/A;  attempted ablation    unsuccessful  Mirena IUD inserted Lot# [id number]   EXP-06/26/2013  . Laparoscopic supracervical hysterectomy  12/10/2011    Procedure: LAPAROSCOPIC SUPRACERVICAL HYSTERECTOMY;  Surgeon: Florian Buff, MD;  Location: [facility];  Service: Gynecology;   Laterality: N/A;  . Iud removal  12/10/2011    Procedure: INTRAUTERINE DEVICE (IUD) REMOVAL;  Surgeon: Florian Buff, MD;  Location: [facility];  Service: Gynecology;  Laterality: N/A;  . Partial hysterectomy    . Abdominal hysterectomy     Family History  Problem Relation Age of Onset  . Anesthesia problems Neg Hx   . Hypotension Neg Hx   . Malignant hyperthermia Neg Hx   . Pseudochol deficiency Neg Hx   . Arthritis Mother   . Alcohol abuse Father   . Cirrhosis Father    History  Substance Use Topics  . Smoking status: Current Some Day Smoker -- 0.25 packs/day for 7 years    Types: Cigarettes  . Smokeless tobacco: Never Used  . Alcohol Use: Yes     Comment: occasionally   OB History   Grav Para Term Preterm Abortions TAB SAB Ect Mult Living                 Review of Systems  Constitutional: Negative for fever and chills.  Gastrointestinal: Negative for vomiting.  Genitourinary: Negative for dysuria and difficulty urinating.  Musculoskeletal: Positive for arthralgias. Negative for joint swelling and neck pain.       Left foot pain  Skin: Negative for color change, rash and wound.  Neurological: Negative for weakness and numbness.  All other systems reviewed and are negative.     Allergies  Review of patient's allergies indicates no known allergies.  Home Medications   Prior to Admission medications   Medication Sig Start  Date End Date Taking? Authorizing Provider  acetaminophen (TYLENOL) 500 MG tablet Take 1,000 mg by mouth daily as needed for pain. Pain.   Yes Historical Provider, MD  Doxylamine Succinate, Sleep, (UNISOM PO) Take 2 tablets by mouth daily as needed (sleep).   Yes Historical Provider, MD  fluticasone (FLONASE) 50 MCG/ACT nasal spray Place 2 sprays into the nose daily as needed for rhinitis or allergies.   Yes Historical Provider, MD  loratadine (CLARITIN) 10 MG tablet Take 10 mg by mouth daily as needed for allergies.    Yes Historical Provider, MD    metFORMIN (GLUCOPHAGE-XR) 500 MG 24 hr tablet Take 500 mg by mouth at bedtime.   Yes Historical Provider, MD  Multiple Vitamins-Minerals (MULTIVITAMINS THER. W/MINERALS) TABS tablet Take 1 tablet by mouth daily.   Yes Historical Provider, MD  naphazoline (CLEAR EYES) 0.012 % ophthalmic solution Place 1 drop into both eyes 4 (four) times daily as needed. Eye Irritation   Yes Historical Provider, MD  ranitidine (ZANTAC) 150 MG tablet Take 150 mg by mouth daily.    Yes Historical Provider, MD  doxycycline (VIBRAMYCIN) 100 MG capsule Take 1 capsule (100 mg total) by mouth 2 (two) times daily. 09/21/13   Hope Bunnie Pion, NP   BP 127/82  Pulse 100  Temp(Src) 98.4 F (36.9 C) (Oral)  Resp 18  Ht 5\' 6"  (1.676 m)  Wt 222 lb (100.699 kg)  BMI 35.85 kg/m2  SpO2 100%  LMP 11/26/2011 Physical Exam  Nursing note and vitals reviewed. Constitutional: She is oriented to person, place, and time. She appears well-developed and well-nourished. No distress.  HENT:  Head: Normocephalic and atraumatic.  Cardiovascular: Normal rate, regular rhythm, normal heart sounds and intact distal pulses.   No murmur heard. Pulmonary/Chest: Effort normal and breath sounds normal. No respiratory distress.  Musculoskeletal: She exhibits tenderness. She exhibits no edema.       Left foot: She exhibits bony tenderness. She exhibits normal range of motion, no swelling, normal capillary refill, no crepitus, no deformity and no laceration.       Feet:  Localized tenderness to palpation to the distal metacarpals of the left foot.  ROM is preserved.  DP pulse is brisk,distal sensation intact.  No erythema, edema, abrasion, bruising, rash or bony deformity.  No proximal tenderness. Compartments of the left leg or soft  Neurological: She is alert and oriented to person, place, and time. She exhibits normal muscle tone. Coordination normal.  Skin: Skin is warm and dry.    ED Course  Procedures (including critical care time) Labs  Review Labs Reviewed - No data to display  Imaging Review Dg Foot Complete Left  09/30/2013   CLINICAL DATA:  Left foot pain.  EXAM: LEFT FOOT - COMPLETE 3+ VIEW  COMPARISON:  None.  FINDINGS: There is no evidence of fracture or dislocation. There is no evidence of arthropathy or other focal bone abnormality. Soft tissues are unremarkable.  IMPRESSION: Negative.   Electronically Signed   By: Rolla Flatten M.D.   On: 09/30/2013 08:55     EKG Interpretation None      MDM   Final diagnoses:  Plantar fasciitis of left foot    Patient with localized tenderness of the left distal foot began after wearing new shoes. No concerning symptoms at this time for cellulitis. No calf pain or edema to suggest DVT. Symptoms are only present with weightbearing. Likely plantar fasciitis. Patient agrees to symptomatic treatment with rest, ice, and anti-inflammatories for pain.  I will give her referral information for orthopedics.  She is well appearing and stable for d/c    Paitlyn Mcclatchey L. Vanessa Helper, PA-C 09/30/13 1324

## 2013-09-30 NOTE — Discharge Instructions (Signed)

## 2013-09-30 NOTE — ED Notes (Signed)
C/o pain to left foot after working.  States she wore new shoes and notice pain to left pinky toe and left ankle.  States this started 2 weeks ago at work.

## 2013-10-02 NOTE — ED Provider Notes (Signed)
Medical screening examination/treatment/procedure(s) were performed by non-physician practitioner and as supervising physician I was immediately available for consultation/collaboration.   EKG Interpretation None        Alfonzo Feller, DO 10/02/13 [phone]

## 2014-02-24 ENCOUNTER — Encounter: Payer: Self-pay | Admitting: Obstetrics and Gynecology

## 2014-02-24 ENCOUNTER — Ambulatory Visit (INDEPENDENT_AMBULATORY_CARE_PROVIDER_SITE_OTHER): Payer: BC Managed Care – PPO | Admitting: Obstetrics and Gynecology

## 2014-02-24 VITALS — BP 104/60 | Ht 67.0 in | Wt 219.0 lb

## 2014-02-24 DIAGNOSIS — Z01419 Encounter for gynecological examination (general) (routine) without abnormal findings: Secondary | ICD-10-CM

## 2014-02-24 DIAGNOSIS — Z0000 Encounter for general adult medical examination without abnormal findings: Secondary | ICD-10-CM | POA: Insufficient documentation

## 2014-02-24 NOTE — Progress Notes (Signed)
Patient ID: Emma Huber, female   DOB: [birth year], 33 y.o.   MRN: [id number] Pt here today for annual exam. Pt states that she has a pink discharge every month for a few days during the week she should have her period. Pt just worried that this is a problem.

## 2014-02-24 NOTE — Progress Notes (Signed)
Patient ID: Emma Huber, female   DOB: [birth year], 33 y.o.   MRN: [id number]   [facility] Visit  Patient name: [name] MRN [id number]  Date of birth: [birth year]  CC & HPI:  Emma Huber is a 33 y.o. female presenting today complaining of pink discharge that occurs that monthly during the week that her menses should start.  She is worried that this might be a problem.  She has a history of partial hysterectomy 2 years ago.  She still has her cervix.   ROS:  All systems have been reviewed and are negative unless otherwise specified in the HPI.   Pertinent History Reviewed:   Reviewed: Significant for  Medical         Past Medical History  Diagnosis Date  . GERD (gastroesophageal reflux disease)   . Obesity   . Prediabetes                               Surgical Hx:    Past Surgical History  Procedure Laterality Date  . Tubal ligation  9 yrs ago    [facility]  . Hysteroscopy w/d&c  03/12/2011    Procedure: DILATATION AND CURETTAGE (D&C) /HYSTEROSCOPY;  Surgeon: Florian Buff, MD;  Location: [facility];  Service: Gynecology;  Laterality: N/A;  . Endometrial ablation  03/12/2011    Procedure: ENDOMETRIAL ABLATION;  Surgeon: Florian Buff, MD;  Location: [facility];  Service: Gynecology;  Laterality: N/A;  attempted ablation    unsuccessful  Mirena IUD inserted Lot# [id number]   EXP-06/26/2013  . Laparoscopic supracervical hysterectomy  12/10/2011    Procedure: LAPAROSCOPIC SUPRACERVICAL HYSTERECTOMY;  Surgeon: Florian Buff, MD;  Location: [facility];  Service: Gynecology;  Laterality: N/A;  . Iud removal  12/10/2011    Procedure: INTRAUTERINE DEVICE (IUD) REMOVAL;  Surgeon: Florian Buff, MD;  Location: [facility];  Service: Gynecology;  Laterality: N/A;  . Partial hysterectomy    . Abdominal hysterectomy     Medications: Reviewed & Updated - see associated section                      Current outpatient prescriptions:acetaminophen (TYLENOL) 500 MG tablet, Take 1,000 mg by mouth daily as  needed for pain. Pain., Disp: , Rfl: ;  fluticasone (FLONASE) 50 MCG/ACT nasal spray, Place 2 sprays into the nose daily as needed for rhinitis or allergies., Disp: , Rfl: ;  metFORMIN (GLUCOPHAGE-XR) 500 MG 24 hr tablet, Take 500 mg by mouth at bedtime., Disp: , Rfl:  montelukast (SINGULAIR) 10 MG tablet, Take 10 mg by mouth at bedtime., Disp: , Rfl: ;  naphazoline (CLEAR EYES) 0.012 % ophthalmic solution, Place 1 drop into both eyes 4 (four) times daily as needed. Eye Irritation, Disp: , Rfl: ;  omeprazole (PRILOSEC) 20 MG capsule, Take 20 mg by mouth daily., Disp: , Rfl:    Social History: Reviewed -  reports that she has been smoking Cigarettes.  She has a .7 pack-year smoking history. She has never used smokeless tobacco.  Objective Findings:  Vitals: Blood pressure 104/60, height 5\' 7"  (1.702 m), weight 219 lb (99.338 kg), last menstrual period 11/26/2011.  Physical Examination: General appearance - alert, well appearing, and in no distress, oriented to person, place, and time and overweight BREAST: normal  Pelvic - VULVA: normal appearing vulva with no masses, tenderness or lesions, excellent support,  VAGINA: normal appearing  vagina with normal color and discharge, no lesions,  CERVIX: normal appearing cervix without discharge or lesions, normal cervix UTERUS: surgically absent, vaginal cuff well healed,  ADNEXA: normal bilaterally   Assessment & Plan:   A:  1. Annual GYN exam  P:  1. Follow-up 3 yrs   This chart was scribed for Jonnie Kind, MD by Donato Schultz, ED Scribe. This patient was seen in Room 2 and the patient's care was started at 10:30 AM.

## 2014-03-02 IMAGING — CT CT ANGIO CHEST
2 of 6 series · 6 of 36 positions shown · IV contrast (Omnipaque 300)
Comparison: Chest radiograph 06/18/2012.

CLINICAL DATA: Chest pain.  Short of breath.  Elevated D-dimer.
Pulmonary embolism.

CT ANGIOGRAPHY CHEST
TECHNIQUE: Multidetector CT imaging of the chest using the
standard protocol during bolus administration of intravenous
contrast. Multiplanar reconstructed images including MIPs were
obtained and reviewed to evaluate the vascular anatomy.
Contrast: 100mL OMNIPAQUE IOHEXOL 350 MG/ML SOLN

[Series 4: pe 3.0 b40f · axial · 0.67mm/px · z∈[-240,-90]mm · 5 of 76 slices shown]
[im 13/76  lung]
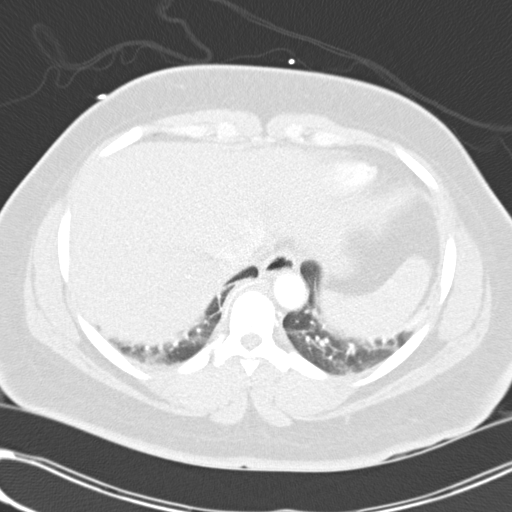
[im 26/76  mediastinal]
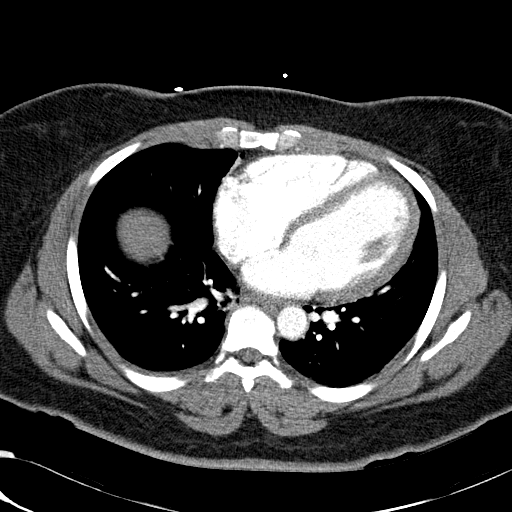
[im 38/76  lung]
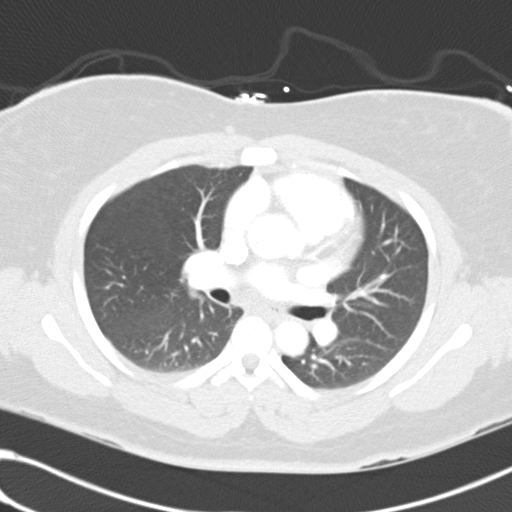
[im 51/76  mediastinal]
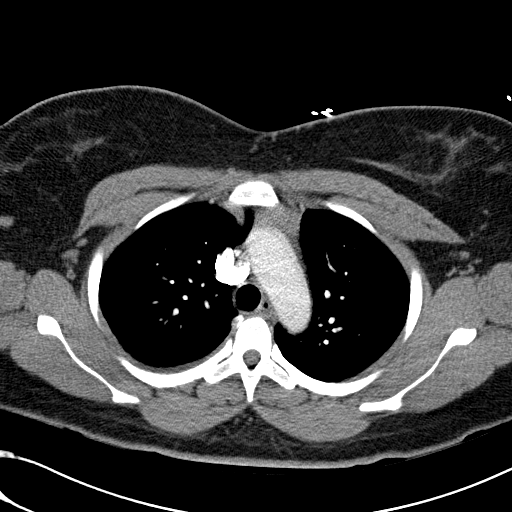
[im 63/76  lung]
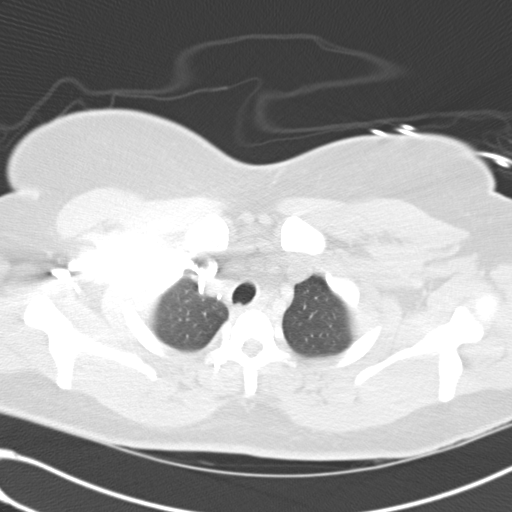

[Series 7: mpr coronal pe 3mm · coronal · 0.45mm/px · 1 of 73 slices shown]
[im 37/73  mediastinal]
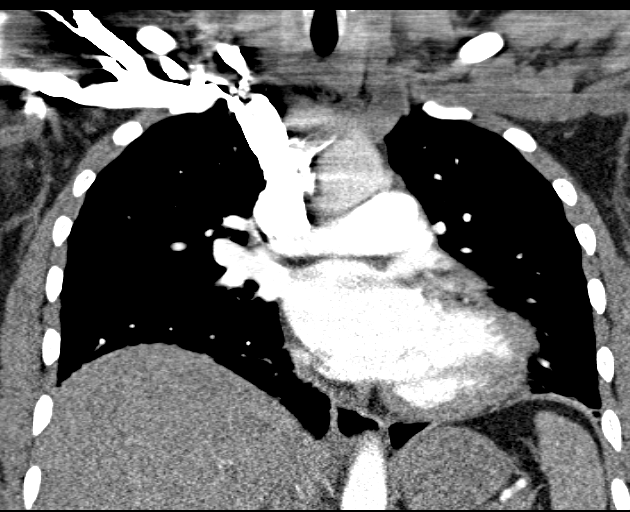

[6 of 36 positions shown; findings below may reference images not displayed]

FINDINGS: Technically adequate study without pulmonary embolus.  No
acute aortic abnormality.  Bovine aortic arch.  There is no
axillary adenopathy.  No mediastinal or hilar adenopathy.  Residual
anterior mediastinal thymic tissue is present.  No effusion.
Incidental imaging of the upper abdomen is within normal limits.
The lungs show dependent basilar atelectasis.  There is no airspace
disease.  There are no aggressive osseous lesions.  Minimal
thoracic spondylosis.
IMPRESSION: Negative CTA chest.

## 2016-09-02 ENCOUNTER — Other Ambulatory Visit: Payer: Self-pay | Admitting: Advanced Practice Midwife

## 2016-09-11 ENCOUNTER — Encounter: Payer: Self-pay | Admitting: Obstetrics and Gynecology

## 2016-09-11 ENCOUNTER — Ambulatory Visit (INDEPENDENT_AMBULATORY_CARE_PROVIDER_SITE_OTHER): Payer: BLUE CROSS/BLUE SHIELD | Admitting: Obstetrics and Gynecology

## 2016-09-11 VITALS — BP 120/70 | HR 74 | Ht 67.0 in | Wt 224.4 lb

## 2016-09-11 DIAGNOSIS — Z01419 Encounter for gynecological examination (general) (routine) without abnormal findings: Secondary | ICD-10-CM | POA: Diagnosis not present

## 2016-09-11 NOTE — Progress Notes (Addendum)
Patient ID: MCKYLA DECKMAN, female   DOB: 09-06-1980, 36 y.o.   MRN: [id number]  Assessment:  Annual Gyn Exam S/p laparoscopic supracervical hysterectomy  Advised pt to stay active   Moderate obesity   Plan:  1. pap smear not done  2. return q 60yrannually or prn 3    Annual mammogram advised at age 13 and regular self exam encouraged   Subjective:   Chief Complaint  Patient presents with  . Gynecologic Exam    physcial/ pelvic examine     ARYELLE FIGG is a 36 y.o. female No obstetric history on file. who presents for annual exam. Patient's last menstrual period was 11/26/2011. The patient complains that she continues to have occasional abdominal cramping and HA as if she is on her period. She also reports SUI.   Discussion: 1. Discussed with pt importance of staying active. Recommended pt count steps using technology like a fit bit or iphone.   At end of discussion, pt had opportunity to ask questions and has no further questions at this time.   Specific discussion of physical activity as noted above. Greater than 50% was spent in counseling and coordination of care with the patient.   Total time greater than: 25 minutes.    The following portions of the patient's history were reviewed and updated as appropriate: allergies, current medications, past family history, past medical history, past social history, past surgical history and problem list. Past Medical History:  Diagnosis Date  . GERD (gastroesophageal reflux disease)   . Obesity   . Prediabetes     Past Surgical History:  Procedure Laterality Date  . ABDOMINAL HYSTERECTOMY    . ENDOMETRIAL ABLATION  03/12/2011   Procedure: ENDOMETRIAL ABLATION;  Surgeon: Florian Buff, MD;  Location: [facility];  Service: Gynecology;  Laterality: N/A;  attempted ablation    unsuccessful  Mirena IUD inserted Lot# [id number]   EXP-06/26/2013  . HYSTEROSCOPY W/D&C  03/12/2011   Procedure: DILATATION AND CURETTAGE (D&C) /HYSTEROSCOPY;   Surgeon: Florian Buff, MD;  Location: [facility];  Service: Gynecology;  Laterality: N/A;  . IUD REMOVAL  12/10/2011   Procedure: INTRAUTERINE DEVICE (IUD) REMOVAL;  Surgeon: Florian Buff, MD;  Location: [facility];  Service: Gynecology;  Laterality: N/A;  . LAPAROSCOPIC SUPRACERVICAL HYSTERECTOMY  12/10/2011   Procedure: LAPAROSCOPIC SUPRACERVICAL HYSTERECTOMY;  Surgeon: Florian Buff, MD;  Location: [facility];  Service: Gynecology;  Laterality: N/A;  . PARTIAL HYSTERECTOMY    . TUBAL LIGATION  9 yrs ago   [facility]     Current Outpatient Prescriptions:  .  acetaminophen (TYLENOL) 500 MG tablet, Take 1,000 mg by mouth daily as needed for pain. Pain., Disp: , Rfl:  .  fluticasone (FLONASE) 50 MCG/ACT nasal spray, Place 2 sprays into the nose daily as needed for rhinitis or allergies., Disp: , Rfl:  .  naphazoline (CLEAR EYES) 0.012 % ophthalmic solution, Place 1 drop into both eyes 4 (four) times daily as needed. Eye Irritation, Disp: , Rfl:   Review of Systems Constitutional: HA Gastrointestinal: abdominal cramping Genitourinary: SUI   Objective:  BP 120/70   Pulse 74   Ht 5\' 7"  (1.702 m)   Wt 224 lb 6.4 oz (101.8 kg)   LMP 11/26/2011   BMI 35.15 kg/m    BMI: Body mass index is 35.15 kg/m.  General Appearance: Alert, appropriate appearance for age. No acute distress HEENT: Grossly normal Neck / Thyroid:  Cardiovascular: RRR; normal S1, S2, no murmur Lungs: CTA  bilaterally Back: No CVAT Breast Exam: No masses or nodes.No dimpling, nipple retraction or discharge. Gastrointestinal: Soft, non-tender, no masses or organomegaly Pelvic Exam:  External genitalia: normal general appearance Vaginal: normal mucosa without prolapse or lesions, good support. Normal appearing secretions  Cervix: normal appearance   Rectovaginal: not indicated Lymphatic Exam: Non-palpable nodes in neck, clavicular, axillary, or inguinal regions  Skin: no rash or abnormalities Neurologic: Normal gait and speech, no  tremor  Psychiatric: Alert and oriented, appropriate affect.  Urinalysis:Not done  Mallory Shirk. MD Pgr [phone] 9:30 AM    By signing my name below, I, Hansel Feinstein, attest that this documentation has been prepared under the direction and in the presence of Jonnie Kind, MD. Electronically Signed: Hansel Feinstein, ED Scribe. 09/11/16. 9:30 AM.  I personally performed the services described in this documentation, which was SCRIBED in my presence. The recorded information has been reviewed and considered accurate. It has been edited as necessary during review. Jonnie Kind, MD

## 2016-10-15 ENCOUNTER — Encounter (HOSPITAL_COMMUNITY): Payer: Self-pay | Admitting: Emergency Medicine

## 2016-10-15 ENCOUNTER — Emergency Department (HOSPITAL_COMMUNITY)
Admission: EM | Admit: 2016-10-15 | Discharge: 2016-10-15 | Disposition: A | Discharge status: Home / Self Care | Payer: BLUE CROSS/BLUE SHIELD | Attending: Emergency Medicine | Admitting: Emergency Medicine

## 2016-10-15 DIAGNOSIS — S61012A Laceration without foreign body of left thumb without damage to nail, initial encounter: Secondary | ICD-10-CM | POA: Diagnosis not present

## 2016-10-15 DIAGNOSIS — Y939 Activity, unspecified: Secondary | ICD-10-CM | POA: Insufficient documentation

## 2016-10-15 DIAGNOSIS — Y999 Unspecified external cause status: Secondary | ICD-10-CM | POA: Diagnosis not present

## 2016-10-15 DIAGNOSIS — Y929 Unspecified place or not applicable: Secondary | ICD-10-CM | POA: Insufficient documentation

## 2016-10-15 DIAGNOSIS — X58XXXA Exposure to other specified factors, initial encounter: Secondary | ICD-10-CM | POA: Diagnosis not present

## 2016-10-15 DIAGNOSIS — Z23 Encounter for immunization: Secondary | ICD-10-CM | POA: Insufficient documentation

## 2016-10-15 DIAGNOSIS — F17210 Nicotine dependence, cigarettes, uncomplicated: Secondary | ICD-10-CM | POA: Diagnosis not present

## 2016-10-15 MED ORDER — BACITRACIN-NEOMYCIN-POLYMYXIN 400-5-5000 EX OINT
TOPICAL_OINTMENT | Freq: Once | CUTANEOUS | Status: AC
  Administered 2016-10-15: 1 via TOPICAL
  Filled 2016-10-15: qty 1

## 2016-10-15 MED ORDER — CEPHALEXIN 500 MG PO CAPS: 500.0000 mg | ORAL_CAPSULE | Freq: Three times a day (TID) | ORAL | 0 refills | Status: DC

## 2016-10-15 MED ORDER — TETANUS-DIPHTH-ACELL PERTUSSIS 5-2.5-18.5 LF-MCG/0.5 IM SUSP
0.5000 mL | Freq: Once | INTRAMUSCULAR | Status: AC
  Administered 2016-10-15: 0.5 mL via INTRAMUSCULAR
  Filled 2016-10-15: qty 0.5

## 2016-10-15 MED ORDER — IBUPROFEN 800 MG PO TABS
800.0000 mg | ORAL_TABLET | Freq: Once | ORAL | Status: AC
  Administered 2016-10-15: 800 mg via ORAL
  Filled 2016-10-15: qty 1

## 2016-10-15 MED ORDER — CEPHALEXIN 500 MG PO CAPS
500.0000 mg | ORAL_CAPSULE | Freq: Once | ORAL | Status: AC
  Administered 2016-10-15: 500 mg via ORAL
  Filled 2016-10-15: qty 1

## 2016-10-15 NOTE — ED Provider Notes (Signed)
[facility] DEPT Provider Note   CSN: [id number] Arrival date & time: 10/15/16  0488     History   Chief Complaint Chief Complaint  Patient presents with  . Laceration    HPI MADALENE MICKLER is a 36 y.o. female.  Patient is a 36 year old female who presents to the emergency department with a complaint of a laceration to the left thumb.  The patient states that 2 days ago she cut her thumb on the lid of a jar. She cleaned it with peroxide she irrigated it with water and she applied Neosporin bandage. The patient states that when she remove the bandage on today she noticed that it had begun to bleed again. She came into the emergency department for evaluation of this cut. The patient is also unsure of the date of her last tetanus. No other cuts appreciated. The patient denies being on any anticoagulation medications. There's been no recent operations or procedures involving the left upper extremity.    Laceration      Past Medical History:  Diagnosis Date  . GERD (gastroesophageal reflux disease)   . Obesity   . Prediabetes     Patient Active Problem List   Diagnosis Date Noted  . Annual physical exam 02/24/2014  . GERD (gastroesophageal reflux disease)   . Obesity   . Prediabetes   . Vaginal spotting 01/03/2013    Past Surgical History:  Procedure Laterality Date  . ABDOMINAL HYSTERECTOMY    . ENDOMETRIAL ABLATION  03/12/2011   Procedure: ENDOMETRIAL ABLATION;  Surgeon: Florian Buff, MD;  Location: [facility];  Service: Gynecology;  Laterality: N/A;  attempted ablation    unsuccessful  Mirena IUD inserted Lot# [id number]   EXP-06/26/2013  . HYSTEROSCOPY W/D&C  03/12/2011   Procedure: DILATATION AND CURETTAGE (D&C) /HYSTEROSCOPY;  Surgeon: Florian Buff, MD;  Location: [facility];  Service: Gynecology;  Laterality: N/A;  . IUD REMOVAL  12/10/2011   Procedure: INTRAUTERINE DEVICE (IUD) REMOVAL;  Surgeon: Florian Buff, MD;  Location: [facility];  Service: Gynecology;   Laterality: N/A;  . LAPAROSCOPIC SUPRACERVICAL HYSTERECTOMY  12/10/2011   Procedure: LAPAROSCOPIC SUPRACERVICAL HYSTERECTOMY;  Surgeon: Florian Buff, MD;  Location: [facility];  Service: Gynecology;  Laterality: N/A;  . PARTIAL HYSTERECTOMY    . TUBAL LIGATION  9 yrs ago   [facility]    OB History    Gravida Para Term Preterm AB Living   4 4 4     4    SAB TAB Ectopic Multiple Live Births                   Home Medications    Prior to Admission medications   Medication Sig Start Date End Date Taking? Authorizing Provider  acetaminophen (TYLENOL) 500 MG tablet Take 1,000 mg by mouth daily as needed for pain. Pain.    [provider]  fluticasone (FLONASE) 50 MCG/ACT nasal spray Place 2 sprays into the nose daily as needed for rhinitis or allergies.    [provider]  naphazoline (CLEAR EYES) 0.012 % ophthalmic solution Place 1 drop into both eyes 4 (four) times daily as needed. Eye Irritation    [provider]    Family History Family History  Problem Relation Age of Onset  . Arthritis Mother   . Alcohol abuse Father   . Cirrhosis Father   . Sickle cell trait Brother   . Anesthesia problems Neg Hx   . Hypotension Neg Hx   . Malignant hyperthermia  Neg Hx   . Pseudochol deficiency Neg Hx     Social History Social History  Substance Use Topics  . Smoking status: Current Some Day Smoker    Packs/day: 0.10    Years: 7.00    Types: Cigarettes  . Smokeless tobacco: Never Used     Comment: Smokes like once a week!  . Alcohol use Yes     Comment: occasionally     Allergies   Patient has no known allergies.   Review of Systems Review of Systems  Skin: Positive for wound.       Laceration left thumb  All other systems reviewed and are negative.    Physical Exam Updated Vital Signs BP (!) 134/95 (BP Location: Left Arm)   Pulse 82   Temp 97.8 F (36.6 C) (Oral)   Resp 18   Ht 5\' 7"  (1.702 m)   Wt 97.5 kg (215 lb)   LMP 11/26/2011   SpO2  100%   BMI 33.67 kg/m   Physical Exam  Constitutional: Vital signs are normal. She appears well-developed and well-nourished. She is active.  HENT:  Head: Normocephalic and atraumatic.  Right Ear: Tympanic membrane, external ear and ear canal normal.  Left Ear: Tympanic membrane, external ear and ear canal normal.  Nose: Nose normal.  Mouth/Throat: Uvula is midline, oropharynx is clear and moist and mucous membranes are normal.  Eyes: Conjunctivae, EOM and lids are normal. Pupils are equal, round, and reactive to light.  Neck: Trachea normal, normal range of motion and phonation normal. Neck supple. Carotid bruit is not present.  Cardiovascular: Normal rate, regular rhythm and normal pulses.   Abdominal: Soft. Normal appearance and bowel sounds are normal.  Musculoskeletal:       Left hand: She exhibits tenderness and laceration. Normal sensation noted. Normal strength noted.       Hands: There is full range of motion of the left thumb, and all fingers of the left hand. There is no red streaks appreciated from the laceration area. There is a 2.1 cm laceration of the palmar surface of the left thumb. No other lacerations of the left upper extremity appreciated. Radial pulses 2+. Capillary refill is less than 2 seconds.  Lymphadenopathy:       Head (right side): No submental, no preauricular and no posterior auricular adenopathy present.       Head (left side): No submental, no preauricular and no posterior auricular adenopathy present.    She has no cervical adenopathy.  Neurological: She is alert. She has normal strength. No cranial nerve deficit or sensory deficit. GCS eye subscore is 4. GCS verbal subscore is 5. GCS motor subscore is 6.  Skin: Skin is warm and dry.  Psychiatric: Her speech is normal.  Nursing note and vitals reviewed.    ED Treatments / Results  Labs (all labs ordered are listed, but only abnormal results are displayed) Labs Reviewed - No data to display  EKG   EKG Interpretation None       Radiology No results found.  Procedures Procedures (including critical care time)  Medications Ordered in ED Medications - No data to display   Initial Impression / Assessment and Plan / ED Course  I have reviewed the triage vital signs and the nursing notes.  Pertinent labs & imaging results that were available during my care of the patient were reviewed by me and considered in my medical decision making (see chart for details).       Final  Clinical Impressions(s) / ED Diagnoses MDM Vital signs within normal limits. Patient has a 2.1 cm laceration of the palmar surface of the left thumb. The wound is 11 days old. Will not attempt to suture the wound on because of its age this point. I've encouraged patient to cleanse the wound daily with soap and water, and apply Neosporin bandage daily. We discussed the importance of attempting to keep the wound clean and dry. The patient's tetanus status was updated. Description for Keflex given to the patient. The patient will use Tylenol, and/or ibuprofen for soreness or discomfort. She will return if any signs of advancing infection. The patient is in agreement with this plan.    Final diagnoses:  Laceration of left thumb without foreign body without damage to nail, initial encounter    New Prescriptions New Prescriptions   CEPHALEXIN (KEFLEX) 500 MG CAPSULE    Take 1 capsule (500 mg total) by mouth 3 (three) times daily. Take with each meal.     Lily Kocher, PA-C 10/15/16 [place], MD 10/18/16 [phone]

## 2016-10-15 NOTE — ED Notes (Addendum)
Finger flushed with normal saline, Neosporin applied to left anterior thumb, nonstick pad, and guaze wrapped around finger.

## 2016-10-15 NOTE — ED Notes (Signed)
Pt tearful, stating that she is "going to have a huge bill for something I was already taking care of at home".  Explained to pt that we have given her a tetanus shot, antibiotics, and cleaned and re-wrapped her wound.

## 2016-10-15 NOTE — Discharge Instructions (Signed)
Your wound could not be closed today because of its age and the potential of advancing infection. Please cleanse wound daily with soap and water. Please apply a Neosporin bandage daily until healed. Keep the wound clean and dry. [facility] or return to the ED for red streaking from the laceration site, pus like drainage, excess swelling, or signs of advancing infection.

## 2016-10-15 NOTE — ED Triage Notes (Addendum)
Patient c/o laceration to left thumb. Per patient cut finger with lid of tin jar on Monday. Patient states that she cleaned laceration with peroxide, applied neosporin and a bandage on finger.Patient states that she removed bandage this morning, finger began to bleed again. Patient unsure of last tetanus vaccination.

## 2018-03-31 ENCOUNTER — Other Ambulatory Visit (HOSPITAL_COMMUNITY)
Admission: RE | Admit: 2018-03-31 | Discharge: 2018-03-31 | Disposition: A | Discharge status: Home / Self Care | Payer: BLUE CROSS/BLUE SHIELD | Source: Ambulatory Visit | Attending: Obstetrics and Gynecology | Admitting: Obstetrics and Gynecology

## 2018-03-31 ENCOUNTER — Encounter: Payer: Self-pay | Admitting: Obstetrics and Gynecology

## 2018-03-31 ENCOUNTER — Other Ambulatory Visit: Payer: Self-pay

## 2018-03-31 ENCOUNTER — Ambulatory Visit (INDEPENDENT_AMBULATORY_CARE_PROVIDER_SITE_OTHER): Payer: BLUE CROSS/BLUE SHIELD | Admitting: Obstetrics and Gynecology

## 2018-03-31 VITALS — BP 110/70 | HR 69 | Ht 67.0 in | Wt 215.0 lb

## 2018-03-31 DIAGNOSIS — Z01419 Encounter for gynecological examination (general) (routine) without abnormal findings: Secondary | ICD-10-CM | POA: Diagnosis not present

## 2018-03-31 NOTE — Progress Notes (Signed)
Patient ID: Emma Huber, female   DOB: January 19, 1981, 38 y.o.   MRN: [id number]   Assessment:  Annual Gyn Exam Plan:  1. pap smear done, next pap due 3 years 2. return annually or prn 3    Annual mammogram advised after age 72 Subjective:  Emma Huber is a 37 y.o. female [phone] who presents for annual exam. Patient's last menstrual period was 11/26/2011. 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 Emma Huber works at [employer] and has moved out, Emma Huber is sophomore in high school.   The following portions of the patient's history were reviewed and updated as appropriate: allergies, current medications, past family history, past medical history, past social history, past surgical history and problem list. Past Medical History:  Diagnosis Date  . GERD (gastroesophageal reflux disease)   . Obesity   . Prediabetes     Past Surgical History:  Procedure Laterality Date  . ABDOMINAL HYSTERECTOMY    . ENDOMETRIAL ABLATION  03/12/2011   Procedure: ENDOMETRIAL ABLATION;  Surgeon: Florian Buff, MD;  Location: [facility];  Service: Gynecology;  Laterality: N/A;  attempted ablation    unsuccessful  Mirena IUD inserted Lot# [id number]   EXP-06/26/2013  . HYSTEROSCOPY W/D&C  03/12/2011   Procedure: DILATATION AND CURETTAGE (D&C) /HYSTEROSCOPY;  Surgeon: Florian Buff, MD;  Location: [facility];  Service: Gynecology;  Laterality: N/A;  . IUD REMOVAL  12/10/2011   Procedure: INTRAUTERINE DEVICE (IUD) REMOVAL;  Surgeon: Florian Buff, MD;  Location: [facility];  Service: Gynecology;  Laterality: N/A;  . LAPAROSCOPIC SUPRACERVICAL HYSTERECTOMY  12/10/2011   Procedure: LAPAROSCOPIC SUPRACERVICAL HYSTERECTOMY;  Surgeon: Florian Buff, MD;  Location: [facility];  Service: Gynecology;   Laterality: N/A;  . PARTIAL HYSTERECTOMY    . TUBAL LIGATION  9 yrs ago   [facility]     Current Outpatient Medications:  .  acetaminophen (TYLENOL) 500 MG tablet, Take 1,000 mg by mouth daily as needed for pain. Pain., Disp: , Rfl:  .  cephALEXin (KEFLEX) 500 MG capsule, Take 1 capsule (500 mg total) by mouth 3 (three) times daily. Take with each meal., Disp: 20 capsule, Rfl: 0 .  fluticasone (FLONASE) 50 MCG/ACT nasal spray, Place 2 sprays into the nose daily as needed for rhinitis or allergies., Disp: , Rfl:  .  naphazoline (CLEAR EYES) 0.012 % ophthalmic solution, Place 1 drop into both eyes 4 (four) times daily as needed. Eye Irritation, Disp: , Rfl:   Review of Systems Constitutional: negative Gastrointestinal: negative Genitourinary: normal  Objective:  LMP 11/26/2011    BMI: There is no height or weight on file to calculate BMI.  General Appearance: Alert, appropriate appearance for age. No acute distress HEENT: Grossly normal Neck / Thyroid:  Cardiovascular: RRR; normal S1, S2, no murmur Lungs: CTA bilaterally Back: No CVAT Breast Exam: Normal to inspection and No masses or nodes.No dimpling, nipple retraction or discharge. Gastrointestinal: Soft, non-tender, no masses or organomegaly Pelvic Exam:  VAGINA: normal   CERVIX: normal appearing, UTERUS: surgically absent PAP: Pap smear done today. Lymphatic Exam: Non-palpable nodes in neck, clavicular, axillary, or inguinal regions  Skin: no rash or abnormalities Neurologic: Normal gait and speech, no tremor  Psychiatric: Alert and oriented,  appropriate affect.  Urinalysis:Not done  By signing my name below, I, Samul Dada, attest that this documentation has been prepared under the direction and in the presence of Jonnie Kind, MD. Electronically Signed: [place]. 03/31/18. 1:29 PM.  I personally performed the services described in this documentation, which was SCRIBED in my presence. The recorded  information has been reviewed and considered accurate. It has been edited as necessary during review. Jonnie Kind, MD

## 2018-04-02 LAB — CYTOLOGY - PAP
Adequacy: ABSENT
CHLAMYDIA, DNA PROBE: NEGATIVE
Diagnosis: NEGATIVE
HPV: DETECTED — AB
Neisseria Gonorrhea: NEGATIVE

## 2018-08-20 ENCOUNTER — Telehealth: Payer: Self-pay

## 2018-08-20 ENCOUNTER — Telehealth: Payer: Self-pay | Admitting: Family Medicine

## 2018-08-20 NOTE — Telephone Encounter (Signed)
Pt called and lmom.  I called her back and voicemail answered.  I lmom

## 2018-08-20 NOTE — Telephone Encounter (Signed)
Ms Mccandlish came by [facility] which is no longer [facility]. Client was wanting to see a primary care provider that she saw here over 2 years ago. Client states she has an eye infection which is beyond scope of [facility] at [facility]. She states she was going to go to an Urgent Care but has lost her Insurance Card. Epic showing client has [employer] active and sheet printed for client and she will come back by to pick it up. 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 Informed client to call [employer] and give policy numbers to see if they can verify her insurance and send her another card. Client did give me permission to do this for her.   Debria Garret RN

## 2019-09-15 ENCOUNTER — Other Ambulatory Visit (INDEPENDENT_AMBULATORY_CARE_PROVIDER_SITE_OTHER): Payer: Medicaid Other | Admitting: *Deleted

## 2019-09-15 ENCOUNTER — Other Ambulatory Visit (HOSPITAL_COMMUNITY)
Admission: RE | Admit: 2019-09-15 | Discharge: 2019-09-15 | Disposition: A | Discharge status: Home / Self Care | Payer: Medicaid Other | Source: Ambulatory Visit | Attending: Obstetrics and Gynecology | Admitting: Obstetrics and Gynecology

## 2019-09-15 DIAGNOSIS — Z113 Encounter for screening for infections with a predominantly sexual mode of transmission: Secondary | ICD-10-CM | POA: Diagnosis not present

## 2019-09-15 NOTE — Progress Notes (Signed)
   NURSE VISIT- VAGINITIS/STD/POC  SUBJECTIVE:  Emma Huber is a 39 y.o. [id number] GYN patientfemale here for a vaginal swab for STD screen.  She reports the following symptoms: none Denies abnormal vaginal bleeding, significant pelvic pain, fever, or UTI symptoms.  OBJECTIVE:  LMP 11/26/2011   Appears well, in no apparent distress  ASSESSMENT: Vaginal swab for STD screen  PLAN: Self-collected vaginal probe for Gonorrhea, Chlamydia, Trichomonas sent to lab Treatment: to be determined once results are received Follow-up as needed if symptoms persist/worsen, or new symptoms develop  Janece Canterbury  09/15/2019 9:57 AM

## 2019-09-15 NOTE — Progress Notes (Signed)
Chart reviewed for nurse visit. Agree with plan of care.  Estill Dooms, NP 09/15/2019 10:08 AM

## 2019-09-16 LAB — CERVICOVAGINAL ANCILLARY ONLY
Chlamydia: NEGATIVE
Comment: NEGATIVE
Comment: NEGATIVE
Comment: NORMAL
Neisseria Gonorrhea: NEGATIVE
Trichomonas: NEGATIVE

## 2019-10-18 ENCOUNTER — Other Ambulatory Visit (HOSPITAL_COMMUNITY)
Admission: RE | Admit: 2019-10-18 | Discharge: 2019-10-18 | Disposition: A | Discharge status: Home / Self Care | Payer: Medicaid Other | Source: Ambulatory Visit | Attending: Adult Health | Admitting: Adult Health

## 2019-10-18 ENCOUNTER — Ambulatory Visit: Payer: Medicaid Other | Admitting: Adult Health

## 2019-10-18 ENCOUNTER — Encounter: Payer: Self-pay | Admitting: Adult Health

## 2019-10-18 VITALS — BP 101/73 | HR 75 | Ht 67.0 in | Wt 205.0 lb

## 2019-10-18 DIAGNOSIS — Z1211 Encounter for screening for malignant neoplasm of colon: Secondary | ICD-10-CM

## 2019-10-18 DIAGNOSIS — Z113 Encounter for screening for infections with a predominantly sexual mode of transmission: Secondary | ICD-10-CM

## 2019-10-18 DIAGNOSIS — Z1212 Encounter for screening for malignant neoplasm of rectum: Secondary | ICD-10-CM | POA: Diagnosis not present

## 2019-10-18 DIAGNOSIS — Z01419 Encounter for gynecological examination (general) (routine) without abnormal findings: Secondary | ICD-10-CM | POA: Diagnosis present

## 2019-10-18 LAB — HEMOCCULT GUIAC POC 1CARD (OFFICE): Fecal Occult Blood, POC: NEGATIVE

## 2019-10-18 NOTE — Progress Notes (Signed)
Patient ID: Emma Huber, female   DOB: [birth year], 39 y.o.   MRN: [id number] History of Present Illness: Emma Huber is a 39 years old black female, single, sp [facility] in for well woman gyn exam  And pap.She works thrid shift.    Current Medications, Allergies, Past Medical History, Past Surgical History, Family History and Social History were reviewed in [employer] record.     Review of Systems: Patient denies any headaches, hearing loss, fatigue, blurred vision, shortness of breath, chest pain, abdominal pain, problems with bowel movements, urination, or intercourse(not active). No joint pain or mood swings.    Physical Exam:BP 101/73 (BP Location: Right Arm, Patient Position: Sitting, Cuff Size: Normal)   Pulse 75   Ht 5\' 7"  (1.702 m)   Wt 205 lb (93 kg)   LMP 11/26/2011   BMI 32.11 kg/m  General:  Well developed, well nourished, no acute distress Skin:  Warm and dry,has several tattoos  Neck:  Midline trachea, normal thyroid, good ROM, no lymphadenopathy Lungs; Clear to auscultation bilaterally Breast:  No dominant palpable mass, retraction, or nipple discharge,has bilateral nipple rods Cardiovascular: Regular rate and rhythm Abdomen:  Soft, non tender, no hepatosplenomegaly Pelvic:  External genitalia is normal in appearance, no lesions.  The vagina is normal in appearance. Urethra has no lesions or masses. The cervix is bulbous. Pap with GC/CHL and high risk HPV 16/18 genotyping performed. Uterus is absent. No adnexal masses or tenderness noted.Bladder is non tender, no masses felt. Rectal: Good sphincter tone, no polyps, or hemorrhoids felt.  Hemoccult negative. Extremities/musculoskeletal:  No swelling or varicosities noted, no clubbing or cyanosis Psych:  No mood changes, alert and cooperative,seems happy AA is 4 Fall risk is low PHQ 9 score is 2, no SI Examination chaperoned by Celene Squibb LPN  Impression and Plan: 1. Encounter for gynecological  examination with Papanicolaou smear of cervix Pap sent Physical in 1 year Pap in 3 if normal Check CBC,CMP,TSH and lipids Mammogram now and yearly  2. Screening for colorectal cancer Colonoscopy advised at 45  3. Screening examination for STD (sexually transmitted disease) Check HIV and RPR

## 2019-10-19 LAB — CYTOLOGY - PAP
Chlamydia: NEGATIVE
Comment: NEGATIVE
Comment: NEGATIVE
Comment: NORMAL
Diagnosis: NEGATIVE
High risk HPV: NEGATIVE
Neisseria Gonorrhea: NEGATIVE

## 2019-10-19 LAB — COMPREHENSIVE METABOLIC PANEL
ALT: 11 IU/L (ref 0–32)
AST: 16 IU/L (ref 0–40)
Albumin/Globulin Ratio: 1.3 (ref 1.2–2.2)
Albumin: 4.4 g/dL (ref 3.8–4.8)
Alkaline Phosphatase: 46 IU/L — ABNORMAL LOW (ref 48–121)
BUN/Creatinine Ratio: 15 (ref 9–23)
BUN: 14 mg/dL (ref 6–24)
Bilirubin Total: 0.3 mg/dL (ref 0.0–1.2)
CO2: 23 mmol/L (ref 20–29)
Calcium: 9.3 mg/dL (ref 8.7–10.2)
Chloride: 102 mmol/L (ref 96–106)
Creatinine, Ser: 0.91 mg/dL (ref 0.57–1.00)
GFR calc Af Amer: 91 mL/min/{1.73_m2} (ref >59)
GFR calc non Af Amer: 79 mL/min/{1.73_m2} (ref >59)
Globulin, Total: 3.3 g/dL (ref 1.5–4.5)
Glucose: 82 mg/dL (ref 65–99)
Potassium: 4.3 mmol/L (ref 3.5–5.2)
Sodium: 137 mmol/L (ref 134–144)
Total Protein: 7.7 g/dL (ref 6.0–8.5)

## 2019-10-19 LAB — CBC
Hematocrit: 40.4 % (ref 34.0–46.6)
Hemoglobin: 13.6 g/dL (ref 11.1–15.9)
MCH: 29.2 pg (ref 26.6–33.0)
MCHC: 33.7 g/dL (ref 31.5–35.7)
MCV: 87 fL (ref 79–97)
Platelets: 234 10*3/uL (ref 150–450)
RBC: 4.66 x10E6/uL (ref 3.77–5.28)
RDW: 13 % (ref 11.7–15.4)
WBC: 6.1 10*3/uL (ref 3.4–10.8)

## 2019-10-19 LAB — LIPID PANEL
Chol/HDL Ratio: 3 ratio (ref 0.0–4.4)
Cholesterol, Total: 192 mg/dL (ref 100–199)
HDL: 63 mg/dL (ref >39)
LDL Chol Calc (NIH): 116 mg/dL — ABNORMAL HIGH (ref 0–99)
Triglycerides: 72 mg/dL (ref 0–149)
VLDL Cholesterol Cal: 13 mg/dL (ref 5–40)

## 2019-10-19 LAB — RPR: RPR Ser Ql: NONREACTIVE

## 2019-10-19 LAB — HIV ANTIBODY (ROUTINE TESTING W REFLEX): HIV Screen 4th Generation wRfx: NONREACTIVE

## 2019-10-19 LAB — TSH: TSH: 0.849 u[IU]/mL (ref 0.450–4.500)

## 2020-09-24 ENCOUNTER — Emergency Department (HOSPITAL_COMMUNITY): Payer: 59

## 2020-09-24 ENCOUNTER — Encounter (HOSPITAL_COMMUNITY): Payer: Self-pay | Admitting: *Deleted

## 2020-09-24 ENCOUNTER — Emergency Department (HOSPITAL_COMMUNITY)
Admission: EM | Admit: 2020-09-24 | Discharge: 2020-09-24 | Disposition: A | Discharge status: Home / Self Care | Payer: 59 | Attending: Emergency Medicine | Admitting: Emergency Medicine

## 2020-09-24 DIAGNOSIS — R079 Chest pain, unspecified: Secondary | ICD-10-CM | POA: Diagnosis not present

## 2020-09-24 DIAGNOSIS — F17210 Nicotine dependence, cigarettes, uncomplicated: Secondary | ICD-10-CM | POA: Insufficient documentation

## 2020-09-24 LAB — CBC WITH DIFFERENTIAL/PLATELET
Abs Immature Granulocytes: 0.02 10*3/uL (ref 0.00–0.07)
Basophils Absolute: 0 10*3/uL (ref 0.0–0.1)
Basophils Relative: 0 %
Eosinophils Absolute: 0 10*3/uL (ref 0.0–0.5)
Eosinophils Relative: 1 %
HCT: 36.6 % (ref 36.0–46.0)
Hemoglobin: 11.8 g/dL — ABNORMAL LOW (ref 12.0–15.0)
Immature Granulocytes: 0 %
Lymphocytes Relative: 22 %
Lymphs Abs: 1.4 10*3/uL (ref 0.7–4.0)
MCH: 30.2 pg (ref 26.0–34.0)
MCHC: 32.2 g/dL (ref 30.0–36.0)
MCV: 93.6 fL (ref 80.0–100.0)
Monocytes Absolute: 0.7 10*3/uL (ref 0.1–1.0)
Monocytes Relative: 11 %
Neutro Abs: 4.4 10*3/uL (ref 1.7–7.7)
Neutrophils Relative %: 66 %
Platelets: 230 10*3/uL (ref 150–400)
RBC: 3.91 MIL/uL (ref 3.87–5.11)
RDW: 13.5 % (ref 11.5–15.5)
WBC: 6.6 10*3/uL (ref 4.0–10.5)
nRBC: 0 % (ref 0.0–0.2)

## 2020-09-24 LAB — BASIC METABOLIC PANEL
Anion gap: 7 (ref 5–15)
BUN: 12 mg/dL (ref 6–20)
CO2: 28 mmol/L (ref 22–32)
Calcium: 8.7 mg/dL — ABNORMAL LOW (ref 8.9–10.3)
Chloride: 99 mmol/L (ref 98–111)
Creatinine, Ser: 1.06 mg/dL — ABNORMAL HIGH (ref 0.44–1.00)
GFR, Estimated: >60 mL/min (ref >60)
Glucose, Bld: 94 mg/dL (ref 70–99)
Potassium: 3.9 mmol/L (ref 3.5–5.1)
Sodium: 134 mmol/L — ABNORMAL LOW (ref 135–145)

## 2020-09-24 LAB — TROPONIN I (HIGH SENSITIVITY): Troponin I (High Sensitivity): <2 ng/L (ref <18)

## 2020-09-24 MED ORDER — FAMOTIDINE 20 MG PO TABS
20.0000 mg | ORAL_TABLET | Freq: Once | ORAL | Status: AC
  Administered 2020-09-24: 20 mg via ORAL
  Filled 2020-09-24: qty 1

## 2020-09-24 MED ORDER — FAMOTIDINE 40 MG PO TABS: 40.0000 mg | ORAL_TABLET | Freq: Every day | ORAL | 0 refills | Status: AC

## 2020-09-24 MED ORDER — METHOCARBAMOL 500 MG PO TABS: 750.0000 mg | ORAL_TABLET | Freq: Once | ORAL | Status: DC

## 2020-09-24 MED ORDER — METHOCARBAMOL 500 MG PO TABS
500.0000 mg | ORAL_TABLET | Freq: Once | ORAL | Status: AC
  Administered 2020-09-24: 500 mg via ORAL
  Filled 2020-09-24: qty 1

## 2020-09-24 MED ORDER — ALUM & MAG HYDROXIDE-SIMETH 200-200-20 MG/5ML PO SUSP
30.0000 mL | Freq: Once | ORAL | Status: AC
  Administered 2020-09-24: 30 mL via ORAL
  Filled 2020-09-24: qty 30

## 2020-09-24 MED ORDER — LIDOCAINE VISCOUS HCL 2 % MT SOLN
15.0000 mL | Freq: Once | OROMUCOSAL | Status: AC
  Administered 2020-09-24: 15 mL via ORAL
  Filled 2020-09-24: qty 15

## 2020-09-24 MED ORDER — ACETAMINOPHEN 325 MG PO TABS
650.0000 mg | ORAL_TABLET | Freq: Once | ORAL | Status: AC
  Administered 2020-09-24: 650 mg via ORAL
  Filled 2020-09-24: qty 2

## 2020-09-24 MED ORDER — OXYCODONE-ACETAMINOPHEN 5-325 MG PO TABS
1.0000 | ORAL_TABLET | Freq: Once | ORAL | Status: AC
  Administered 2020-09-24: 1 via ORAL
  Filled 2020-09-24: qty 1

## 2020-09-24 MED ORDER — METHOCARBAMOL 500 MG PO TABS: 500.0000 mg | ORAL_TABLET | Freq: Two times a day (BID) | ORAL | 0 refills | Status: AC

## 2020-09-24 NOTE — ED Provider Notes (Signed)
[facility] EMERGENCY DEPARTMENT Provider Note   CSN: [id number] Arrival date & time: 09/24/20  1749     History Chief Complaint  Patient presents with  . Chest Pain    Emma Huber is a 40 y.o. female.  HPI Patient is a 40 year old female with past medical history significant for obesity, reflux  Patient presented today with chest pain for the past 3 days.  She states that seems to be worsened by laying down, leaning forward, pushing on her chest and deep breathing.  She denies any hemoptysis.  No lightheadedness or dizziness.  She states she does not feel short of breath apart from when she takes a deep breath and it hurts in her chest.  She has not taken any medications prior to arrival.  No recent surgeries, hospitalization, long travel, hemoptysis, estrogen containing OCP, cancer history.  No unilateral leg swelling.  No history of PE or VTE.  She states that the pain is achy constant and worse with the aggravating activities discussed above.  No palliating events.  She has not taken any medications to attempt to improve her symptoms other than Tums which she states were ineffective.  No fevers or chills.  Notably patient is recovering from [place] several months ago.     Past Medical History:  Diagnosis Date  . GERD (gastroesophageal reflux disease)   . Obesity   . Prediabetes     Patient Active Problem List   Diagnosis Date Noted  . Screening for colorectal cancer 10/18/2019  . Encounter for gynecological examination with Papanicolaou smear of cervix 10/18/2019  . Annual physical exam 02/24/2014  . GERD (gastroesophageal reflux disease)   . Obesity   . Prediabetes   . Vaginal spotting 01/03/2013    Past Surgical History:  Procedure Laterality Date  . ABDOMINAL HYSTERECTOMY    . ENDOMETRIAL ABLATION  03/12/2011   Procedure: ENDOMETRIAL ABLATION;  Surgeon: Florian Buff, MD;  Location: [facility];  Service: Gynecology;  Laterality: N/A;  attempted ablation     unsuccessful  Mirena IUD inserted Lot# [id number]   EXP-06/26/2013  . HYSTEROSCOPY WITH D & C  03/12/2011   Procedure: DILATATION AND CURETTAGE (D&C) /HYSTEROSCOPY;  Surgeon: Florian Buff, MD;  Location: [facility];  Service: Gynecology;  Laterality: N/A;  . IUD REMOVAL  12/10/2011   Procedure: INTRAUTERINE DEVICE (IUD) REMOVAL;  Surgeon: Florian Buff, MD;  Location: [facility];  Service: Gynecology;  Laterality: N/A;  . LAPAROSCOPIC SUPRACERVICAL HYSTERECTOMY  12/10/2011   Procedure: LAPAROSCOPIC SUPRACERVICAL HYSTERECTOMY;  Surgeon: Florian Buff, MD;  Location: [facility];  Service: Gynecology;  Laterality: N/A;  . PARTIAL HYSTERECTOMY    . TUBAL LIGATION  9 yrs ago   [facility]     OB History    Gravida  6   Para  4   Term  4   Preterm      AB  2   Living  4     SAB  1   IAB  1   Ectopic      Multiple      Live Births              Family History  Problem Relation Age of Onset  . Arthritis Mother   . Alcohol abuse Father   . Cirrhosis Father   . Sickle cell trait Brother   . Anesthesia problems Neg Hx   . Hypotension Neg Hx   . Malignant hyperthermia Neg Hx   . Pseudochol deficiency  Neg Hx     Social History   Tobacco Use  . Smoking status: Current Some Day Smoker    Packs/day: 0.10    Years: 7.00    Pack years: 0.70    Types: Cigarettes  . Smokeless tobacco: Never Used  . Tobacco comment: Smokes like once a week!  Vaping Use  . Vaping Use: Never used  Substance Use Topics  . Alcohol use: Yes    Comment: occasionally  . Drug use: No    Home Medications Prior to Admission medications   Medication Sig Start Date End Date Taking? Authorizing Provider  famotidine (PEPCID) 40 MG tablet Take 1 tablet (40 mg total) by mouth daily for 14 days. 09/24/20 10/08/20 Yes Autumn Gunn S, PA  methocarbamol (ROBAXIN) 500 MG tablet Take 1 tablet (500 mg total) by mouth 2 (two) times daily for 14 days. 09/24/20 10/08/20 Yes Johnnell Liou, Ova Freshwater S, PA  acetaminophen (TYLENOL) 500 MG  tablet Take 1,000 mg by mouth daily as needed for pain. Pain. Patient not taking: Reported on 10/18/2019    [provider]  fluticasone (FLONASE) 50 MCG/ACT nasal spray Place 2 sprays into the nose daily as needed for rhinitis or allergies. Patient not taking: Reported on 10/18/2019    [provider]  naphazoline (CLEAR EYES) 0.012 % ophthalmic solution Place 1 drop into both eyes 4 (four) times daily as needed. Eye Irritation Patient not taking: Reported on 10/18/2019    [provider]    Allergies    Patient has no known allergies.  Review of Systems   Review of Systems  Constitutional: Negative for chills and fever.  HENT: Negative for congestion.   Eyes: Negative for pain.  Respiratory: Negative for cough.   Cardiovascular: Positive for chest pain. Negative for leg swelling.  Gastrointestinal: Negative for abdominal pain, diarrhea, nausea and vomiting.  Genitourinary: Negative for dysuria.  Musculoskeletal: Negative for myalgias.  Skin: Negative for rash.  Neurological: Negative for dizziness and headaches.    Physical Exam Updated Vital Signs BP (!) 128/99   Pulse (!) 50   Temp 98.3 F (36.8 C) (Oral)   Resp 17   LMP 11/26/2011   SpO2 98%   Physical Exam Vitals and nursing note reviewed.  Constitutional:      General: She is not in acute distress.    Appearance: She is obese.     Comments: Uncomfortable 40 year old female --pleasant, able to answer questions appropriate follow commands.  Speaking in full sentences.  HENT:     Head: Normocephalic and atraumatic.     Nose: Nose normal.  Eyes:     General: No scleral icterus. Cardiovascular:     Rate and Rhythm: Normal rate and regular rhythm.     Pulses: Normal pulses.     Heart sounds: Normal heart sounds.     Comments: Heart rate approximately 60.  No murmurs rubs or gallops. Pulmonary:     Effort: Pulmonary effort is normal. No respiratory distress.     Breath sounds: No wheezing.      Comments: Patient has left-sided chest TTP that reproduces her exact symptoms. Chest:     Chest wall: Tenderness present.  Abdominal:     Palpations: Abdomen is soft.     Tenderness: There is no abdominal tenderness. There is no guarding or rebound.  Musculoskeletal:     Cervical back: Normal range of motion.     Right lower leg: No edema.     Left lower leg:  No edema.     Comments: No lower extremity edema or calf TTP  Skin:    General: Skin is warm and dry.     Capillary Refill: Capillary refill takes less than 2 seconds.  Neurological:     Mental Status: She is alert. Mental status is at baseline.  Psychiatric:        Mood and Affect: Mood normal.        Behavior: Behavior normal.     ED Results / Procedures / Treatments   Labs (all labs ordered are listed, but only abnormal results are displayed) Labs Reviewed  BASIC METABOLIC PANEL - Abnormal; Notable for the following components:      Result Value   Sodium 134 (*)    Creatinine, Ser 1.06 (*)    Calcium 8.7 (*)    All other components within normal limits  CBC WITH DIFFERENTIAL/PLATELET - Abnormal; Notable for the following components:   Hemoglobin 11.8 (*)    All other components within normal limits  TROPONIN I (HIGH SENSITIVITY)    EKG EKG Interpretation  Date/Time:  Monday Sep 24 2020 18:01:26 EDT Ventricular Rate:  75 PR Interval:  155 QRS Duration: 89 QT Interval:  388 QTC Calculation: 434 R Axis:   37 Text Interpretation: Sinus rhythm Probable left atrial enlargement No significant change since prior 2/14 Confirmed by Aletta Edouard [phone]) on 09/24/2020 6:21:54 PM   Radiology DG Chest 2 View  Result Date: 09/24/2020 CLINICAL DATA:  Chest and upper back pain, indigestion for 3 days, short of breath EXAM: CHEST - 2 VIEW COMPARISON:  06/18/2012 FINDINGS: Frontal and lateral views of the chest demonstrate an unremarkable cardiac silhouette. No airspace disease, effusion, or pneumothorax. No acute  bony abnormalities. IMPRESSION: 1. No acute intrathoracic process. Electronically Signed   By: Randa Ngo M.D.   On: 09/24/2020 19:51    Procedures Procedures   Medications Ordered in ED Medications  acetaminophen (TYLENOL) tablet 650 mg (650 mg Oral Given 09/24/20 1912)  alum & mag hydroxide-simeth (MAALOX/MYLANTA) 200-200-20 MG/5ML suspension 30 mL (30 mLs Oral Given 09/24/20 1914)    And  lidocaine (XYLOCAINE) 2 % viscous mouth solution 15 mL (15 mLs Oral Given 09/24/20 1914)  famotidine (PEPCID) tablet 20 mg (20 mg Oral Given 09/24/20 1913)  oxyCODONE-acetaminophen (PERCOCET/ROXICET) 5-325 MG per tablet 1 tablet (1 tablet Oral Given 09/24/20 1912)  methocarbamol (ROBAXIN) tablet 500 mg (500 mg Oral Given 09/24/20 1912)    ED Course  I have reviewed the triage vital signs and the nursing notes.  Pertinent labs & imaging results that were available during my care of the patient were reviewed by me and considered in my medical decision making (see chart for details).  Patient is a 40 year old female presented today with sharp chest pain that is pleuritic but nonexertional also seem to be associated with her position.  Initial DDx includes costochondritis, PE, pericarditis, reflux.  Physical exam is reassuring she has reproducible anterior chest wall tenderness palpation which reproduces her exact symptoms.  Clear to auscultation all fields.  No lower extremity edema or calf tenderness.  She is PERC negative and has a low heart score.  Non-smoker and denies any recreational drugs.  Will treat for reflux and MSK and see if improvement.  Clinical Course as of 09/24/20 2046  Mon Sep 24, 2020  2002 Troponin undetectable.  CBC without leukocytosis or significant anemia.  BMP unremarkable. [WF]    Clinical Course User Index [WF] Tedd Sias, [place]  On reassessment complete resolution of symptoms.   CXR reviewed - IMPRESSION:  1. No acute intrathoracic process.   EKG reviewed by  myself as well as Dr. Melina Copa.  Normal sinus rhythm no ST-T wave abnormalities of note.  No S1Q3T3.  No tachycardia.  MDM Rules/Calculators/A&P                          RICKETTA COLANTONIO was evaluated in Emergency Department on 09/24/2020 for the symptoms described in the history of present illness. She was evaluated in the context of the global COVID-19 pandemic, which necessitated consideration that the patient might be at risk for infection with the SARS-CoV-2 virus that causes COVID-19. Institutional protocols and algorithms that pertain to the evaluation of patients at risk for COVID-19 are in a state of rapid change based on information released by regulatory bodies including the CDC and federal and state organizations. These policies and algorithms were followed during the patient's care in the ED.   Final Clinical Impression(s) / ED Diagnoses Final diagnoses:  Chest pain, unspecified type    Rx / DC Orders ED Discharge Orders         Ordered    methocarbamol (ROBAXIN) 500 MG tablet  2 times daily        09/24/20 2017    famotidine (PEPCID) 40 MG tablet  Daily        09/24/20 2017           Pati Gallo [place], [place] 09/24/20 2048    Hayden Rasmussen, MD 09/25/20 1043

## 2020-09-24 NOTE — Discharge Instructions (Addendum)
Your work-up today was quite reassuring.  I have a very high suspicion for a condition called costochondritis.  As we discussed, I would like you to return for any symptoms concerning for a blood clot which include worsening chest pain or shortness of breath, coughing up blood, or any other new or concerning symptoms.  Otherwise know that the symptoms may persist for weeks.  Please use Tylenol and ibuprofen as discussed below.  This is a primary and most important treatment.  Muscle relaxer prescribed you may also be helpful.  I also prescribed you Pepcid because you are having some symptoms that may be consistent with reflux.  Please use Tylenol or ibuprofen for pain.  You may use 600 mg ibuprofen every 6 hours or 1000 mg of Tylenol every 6 hours.  You may choose to alternate between the 2.  This would be most effective.  Not to exceed 4 g of Tylenol within 24 hours.  Not to exceed 3200 mg ibuprofen 24 hours.

## 2020-09-24 NOTE — ED Triage Notes (Signed)
Pain in chest, worse when breathing x 3 days

## 2021-08-15 ENCOUNTER — Other Ambulatory Visit (HOSPITAL_COMMUNITY): Payer: Self-pay | Admitting: Family Medicine

## 2021-08-15 DIAGNOSIS — Z1231 Encounter for screening mammogram for malignant neoplasm of breast: Secondary | ICD-10-CM

## 2021-08-19 ENCOUNTER — Ambulatory Visit (HOSPITAL_COMMUNITY)
Admission: RE | Admit: 2021-08-19 | Discharge: 2021-08-19 | Disposition: A | Discharge status: Home / Self Care | Payer: 59 | Source: Ambulatory Visit | Attending: Family Medicine | Admitting: Family Medicine

## 2021-08-19 DIAGNOSIS — Z1231 Encounter for screening mammogram for malignant neoplasm of breast: Secondary | ICD-10-CM | POA: Insufficient documentation
